# Patient Record
Sex: Female | Born: 1960
Health system: Southern US, Community
[De-identification: ages and names within clinical notes are randomized; demographics above are authoritative.]

## PROBLEM LIST (undated history)

## (undated) DIAGNOSIS — D649 Anemia, unspecified: Secondary | ICD-10-CM

## (undated) DIAGNOSIS — G8929 Other chronic pain: Secondary | ICD-10-CM

## (undated) DIAGNOSIS — H9190 Unspecified hearing loss, unspecified ear: Secondary | ICD-10-CM

## (undated) DIAGNOSIS — M797 Fibromyalgia: Secondary | ICD-10-CM

## (undated) DIAGNOSIS — I10 Essential (primary) hypertension: Secondary | ICD-10-CM

## (undated) DIAGNOSIS — I639 Cerebral infarction, unspecified: Secondary | ICD-10-CM

## (undated) DIAGNOSIS — R531 Weakness: Secondary | ICD-10-CM

## (undated) DIAGNOSIS — M549 Dorsalgia, unspecified: Secondary | ICD-10-CM

## (undated) DIAGNOSIS — F419 Anxiety disorder, unspecified: Secondary | ICD-10-CM

## (undated) DIAGNOSIS — R51 Headache: Secondary | ICD-10-CM

## (undated) DIAGNOSIS — R519 Headache, unspecified: Secondary | ICD-10-CM

## (undated) HISTORY — DX: Anemia, unspecified: D64.9

## (undated) HISTORY — DX: Headache: R51

## (undated) HISTORY — DX: Cerebral infarction, unspecified: I63.9

## (undated) HISTORY — DX: Unspecified hearing loss, unspecified ear: H91.90

## (undated) HISTORY — PX: SMALL INTESTINE SURGERY: SHX150

## (undated) HISTORY — DX: Weakness: R53.1

## (undated) HISTORY — DX: Headache, unspecified: R51.9

---

## 1998-12-15 HISTORY — PX: HERNIA REPAIR: SHX51

## 2008-12-15 HISTORY — PX: OTHER SURGICAL HISTORY: SHX169

## 2012-06-16 ENCOUNTER — Encounter (HOSPITAL_COMMUNITY): Payer: Self-pay | Admitting: Radiology

## 2012-06-16 ENCOUNTER — Inpatient Hospital Stay (HOSPITAL_COMMUNITY)
Admission: EM | Admit: 2012-06-16 | Discharge: 2012-06-21 | DRG: 988 | Disposition: A | Discharge status: Home / Self Care | Payer: Medicare Other | Attending: General Surgery | Admitting: General Surgery

## 2012-06-16 ENCOUNTER — Encounter (INDEPENDENT_AMBULATORY_CARE_PROVIDER_SITE_OTHER): Payer: Self-pay | Admitting: Surgery

## 2012-06-16 ENCOUNTER — Emergency Department (HOSPITAL_COMMUNITY): Payer: Medicare Other

## 2012-06-16 DIAGNOSIS — S51819A Laceration without foreign body of unspecified forearm, initial encounter: Secondary | ICD-10-CM

## 2012-06-16 DIAGNOSIS — IMO0001 Reserved for inherently not codable concepts without codable children: Secondary | ICD-10-CM | POA: Diagnosis present

## 2012-06-16 DIAGNOSIS — S2241XA Multiple fractures of ribs, right side, initial encounter for closed fracture: Secondary | ICD-10-CM

## 2012-06-16 DIAGNOSIS — Y92410 Unspecified street and highway as the place of occurrence of the external cause: Secondary | ICD-10-CM

## 2012-06-16 DIAGNOSIS — Y998 Other external cause status: Secondary | ICD-10-CM

## 2012-06-16 DIAGNOSIS — E876 Hypokalemia: Secondary | ICD-10-CM

## 2012-06-16 DIAGNOSIS — S060X9A Concussion with loss of consciousness of unspecified duration, initial encounter: Secondary | ICD-10-CM | POA: Diagnosis present

## 2012-06-16 DIAGNOSIS — S060X0A Concussion without loss of consciousness, initial encounter: Principal | ICD-10-CM | POA: Diagnosis present

## 2012-06-16 DIAGNOSIS — E278 Other specified disorders of adrenal gland: Secondary | ICD-10-CM | POA: Diagnosis present

## 2012-06-16 DIAGNOSIS — S82832A Other fracture of upper and lower end of left fibula, initial encounter for closed fracture: Secondary | ICD-10-CM | POA: Diagnosis present

## 2012-06-16 DIAGNOSIS — F419 Anxiety disorder, unspecified: Secondary | ICD-10-CM | POA: Diagnosis present

## 2012-06-16 DIAGNOSIS — S0990XA Unspecified injury of head, initial encounter: Secondary | ICD-10-CM

## 2012-06-16 DIAGNOSIS — I10 Essential (primary) hypertension: Secondary | ICD-10-CM

## 2012-06-16 DIAGNOSIS — S82899A Other fracture of unspecified lower leg, initial encounter for closed fracture: Secondary | ICD-10-CM | POA: Diagnosis present

## 2012-06-16 DIAGNOSIS — D3500 Benign neoplasm of unspecified adrenal gland: Secondary | ICD-10-CM | POA: Diagnosis present

## 2012-06-16 DIAGNOSIS — G8929 Other chronic pain: Secondary | ICD-10-CM

## 2012-06-16 DIAGNOSIS — IMO0002 Reserved for concepts with insufficient information to code with codable children: Secondary | ICD-10-CM

## 2012-06-16 DIAGNOSIS — S66909A Unspecified injury of unspecified muscle, fascia and tendon at wrist and hand level, unspecified hand, initial encounter: Secondary | ICD-10-CM

## 2012-06-16 DIAGNOSIS — S56921A Laceration of unspecified muscles, fascia and tendons at forearm level, right arm, initial encounter: Secondary | ICD-10-CM | POA: Diagnosis present

## 2012-06-16 DIAGNOSIS — S060XAA Concussion with loss of consciousness status unknown, initial encounter: Secondary | ICD-10-CM

## 2012-06-16 DIAGNOSIS — F411 Generalized anxiety disorder: Secondary | ICD-10-CM | POA: Diagnosis present

## 2012-06-16 DIAGNOSIS — T07XXXA Unspecified multiple injuries, initial encounter: Secondary | ICD-10-CM

## 2012-06-16 DIAGNOSIS — D62 Acute posthemorrhagic anemia: Secondary | ICD-10-CM | POA: Diagnosis not present

## 2012-06-16 DIAGNOSIS — S51012A Laceration without foreign body of left elbow, initial encounter: Secondary | ICD-10-CM | POA: Diagnosis present

## 2012-06-16 DIAGNOSIS — R404 Transient alteration of awareness: Secondary | ICD-10-CM

## 2012-06-16 DIAGNOSIS — S61409A Unspecified open wound of unspecified hand, initial encounter: Secondary | ICD-10-CM | POA: Diagnosis present

## 2012-06-16 DIAGNOSIS — S2249XA Multiple fractures of ribs, unspecified side, initial encounter for closed fracture: Secondary | ICD-10-CM

## 2012-06-16 DIAGNOSIS — R413 Other amnesia: Secondary | ICD-10-CM

## 2012-06-16 DIAGNOSIS — M797 Fibromyalgia: Secondary | ICD-10-CM

## 2012-06-16 DIAGNOSIS — S82409A Unspecified fracture of shaft of unspecified fibula, initial encounter for closed fracture: Secondary | ICD-10-CM

## 2012-06-16 DIAGNOSIS — M549 Dorsalgia, unspecified: Secondary | ICD-10-CM | POA: Diagnosis present

## 2012-06-16 DIAGNOSIS — S51811A Laceration without foreign body of right forearm, initial encounter: Secondary | ICD-10-CM | POA: Diagnosis present

## 2012-06-16 DIAGNOSIS — S51009A Unspecified open wound of unspecified elbow, initial encounter: Secondary | ICD-10-CM

## 2012-06-16 HISTORY — DX: Dorsalgia, unspecified: M54.9

## 2012-06-16 HISTORY — DX: Anxiety disorder, unspecified: F41.9

## 2012-06-16 HISTORY — DX: Concussion with loss of consciousness status unknown, initial encounter: S06.0XAA

## 2012-06-16 HISTORY — DX: Fibromyalgia: M79.7

## 2012-06-16 HISTORY — DX: Essential (primary) hypertension: I10

## 2012-06-16 HISTORY — DX: Other fracture of upper and lower end of left fibula, initial encounter for closed fracture: S82.832A

## 2012-06-16 HISTORY — DX: Other chronic pain: G89.29

## 2012-06-16 HISTORY — DX: Multiple fractures of ribs, right side, initial encounter for closed fracture: S22.41XA

## 2012-06-16 LAB — POCT I-STAT, CHEM 8
BUN: 13 mg/dL (ref 6–23)
Calcium, Ion: 1.26 mmol/L (ref 1.12–1.32)
Chloride: 107 mEq/L (ref 96–112)
Creatinine, Ser: 0.8 mg/dL (ref 0.50–1.10)
Glucose, Bld: 115 mg/dL — ABNORMAL HIGH (ref 70–99)
HCT: 41 % (ref 36.0–46.0)
Hemoglobin: 13.9 g/dL (ref 12.0–15.0)
Potassium: 3.3 mEq/L — ABNORMAL LOW (ref 3.5–5.1)
Sodium: 143 mEq/L (ref 135–145)
TCO2: 25 mmol/L (ref 0–100)

## 2012-06-16 LAB — PROTIME-INR
INR: 0.93 (ref 0.00–1.49)
Prothrombin Time: 12.7 seconds (ref 11.6–15.2)

## 2012-06-16 LAB — ETHANOL: Alcohol, Ethyl (B): <11 mg/dL (ref 0–11)

## 2012-06-16 LAB — TYPE AND SCREEN
ABO/RH(D): O POS
Antibody Screen: NEGATIVE

## 2012-06-16 LAB — COMPREHENSIVE METABOLIC PANEL
ALT: 40 U/L — ABNORMAL HIGH (ref 0–35)
AST: 59 U/L — ABNORMAL HIGH (ref 0–37)
Albumin: 3.8 g/dL (ref 3.5–5.2)
Alkaline Phosphatase: 82 U/L (ref 39–117)
BUN: 13 mg/dL (ref 6–23)
CO2: 22 mEq/L (ref 19–32)
Calcium: 9.8 mg/dL (ref 8.4–10.5)
Chloride: 106 mEq/L (ref 96–112)
Creatinine, Ser: 0.71 mg/dL (ref 0.50–1.10)
GFR calc Af Amer: >90 mL/min (ref >90)
GFR calc non Af Amer: >90 mL/min (ref >90)
Glucose, Bld: 123 mg/dL — ABNORMAL HIGH (ref 70–99)
Potassium: 3.4 mEq/L — ABNORMAL LOW (ref 3.5–5.1)
Sodium: 144 mEq/L (ref 135–145)
Total Bilirubin: 0.2 mg/dL — ABNORMAL LOW (ref 0.3–1.2)
Total Protein: 7 g/dL (ref 6.0–8.3)

## 2012-06-16 LAB — CBC
HCT: 40.8 % (ref 36.0–46.0)
Hemoglobin: 13.6 g/dL (ref 12.0–15.0)
MCH: 28.6 pg (ref 26.0–34.0)
MCHC: 33.3 g/dL (ref 30.0–36.0)
MCV: 85.9 fL (ref 78.0–100.0)
Platelets: 216 10*3/uL (ref 150–400)
RBC: 4.75 MIL/uL (ref 3.87–5.11)
RDW: 13.6 % (ref 11.5–15.5)
WBC: 17.5 10*3/uL — ABNORMAL HIGH (ref 4.0–10.5)

## 2012-06-16 LAB — URINALYSIS, ROUTINE W REFLEX MICROSCOPIC
Bilirubin Urine: NEGATIVE
Glucose, UA: NEGATIVE mg/dL
Hgb urine dipstick: NEGATIVE
Ketones, ur: NEGATIVE mg/dL
Leukocytes, UA: NEGATIVE
Nitrite: NEGATIVE
Protein, ur: NEGATIVE mg/dL
Specific Gravity, Urine: 1.018 (ref 1.005–1.030)
Urobilinogen, UA: 1 mg/dL (ref 0.0–1.0)
pH: 7 (ref 5.0–8.0)

## 2012-06-16 LAB — ABO/RH: ABO/RH(D): O POS

## 2012-06-16 LAB — LACTIC ACID, PLASMA: Lactic Acid, Venous: 1.8 mmol/L (ref 0.5–2.2)

## 2012-06-16 MED ORDER — ONDANSETRON HCL 4 MG/2ML IJ SOLN
4.0000 mg | Freq: Once | INTRAMUSCULAR | Status: AC
  Administered 2012-06-16: 4 mg via INTRAVENOUS
  Filled 2012-06-16: qty 2

## 2012-06-16 MED ORDER — FENTANYL CITRATE 0.05 MG/ML IJ SOLN
INTRAMUSCULAR | Status: AC
  Administered 2012-06-16: 100 ug via INTRAVENOUS
  Filled 2012-06-16: qty 2

## 2012-06-16 MED ORDER — ACETAMINOPHEN 650 MG RE SUPP: 650.0000 mg | Freq: Four times a day (QID) | RECTAL | Status: DC | PRN

## 2012-06-16 MED ORDER — PROMETHAZINE HCL 25 MG/ML IJ SOLN: 12.5000 mg | Freq: Four times a day (QID) | INTRAMUSCULAR | Status: DC | PRN

## 2012-06-16 MED ORDER — ACETAMINOPHEN 325 MG PO TABS
325.0000 mg | ORAL_TABLET | Freq: Four times a day (QID) | ORAL | Status: DC | PRN
  Administered 2012-06-17: 650 mg via ORAL
  Filled 2012-06-16: qty 2

## 2012-06-16 MED ORDER — KETOROLAC TROMETHAMINE 15 MG/ML IJ SOLN
15.0000 mg | INTRAMUSCULAR | Status: AC
  Administered 2012-06-16: 15 mg via INTRAVENOUS
  Filled 2012-06-16: qty 1

## 2012-06-16 MED ORDER — CLINDAMYCIN PHOSPHATE 900 MG/50ML IV SOLN
900.0000 mg | INTRAVENOUS | Status: AC
  Administered 2012-06-16: 900 mg via INTRAVENOUS
  Filled 2012-06-16: qty 50

## 2012-06-16 MED ORDER — ONDANSETRON HCL 4 MG/2ML IJ SOLN: 4.0000 mg | Freq: Four times a day (QID) | INTRAMUSCULAR | Status: DC | PRN

## 2012-06-16 MED ORDER — HYDROMORPHONE HCL PF 1 MG/ML IJ SOLN
INTRAMUSCULAR | Status: AC
  Administered 2012-06-16: 1 mg via INTRAVENOUS
  Filled 2012-06-16: qty 1

## 2012-06-16 MED ORDER — ALUM & MAG HYDROXIDE-SIMETH 200-200-20 MG/5ML PO SUSP: 30.0000 mL | Freq: Four times a day (QID) | ORAL | Status: DC | PRN

## 2012-06-16 MED ORDER — HYDROMORPHONE HCL PF 1 MG/ML IJ SOLN
1.0000 mg | Freq: Once | INTRAMUSCULAR | Status: AC
  Administered 2012-06-16: 1 mg via INTRAVENOUS

## 2012-06-16 MED ORDER — ENOXAPARIN SODIUM 40 MG/0.4ML ~~LOC~~ SOLN
40.0000 mg | SUBCUTANEOUS | Status: DC
  Administered 2012-06-17 – 2012-06-21 (×4): 40 mg via SUBCUTANEOUS
  Filled 2012-06-16 (×5): qty 0.4

## 2012-06-16 MED ORDER — NAPROXEN SODIUM 550 MG PO TABS: 550.0000 mg | ORAL_TABLET | Freq: Two times a day (BID) | ORAL | Status: DC

## 2012-06-16 MED ORDER — KETOROLAC TROMETHAMINE 60 MG/2ML IM SOLN
INTRAMUSCULAR | Status: AC
  Filled 2012-06-16: qty 2

## 2012-06-16 MED ORDER — LOSARTAN POTASSIUM-HCTZ 100-12.5 MG PO TABS: 1.0000 | ORAL_TABLET | Freq: Every day | ORAL | Status: DC

## 2012-06-16 MED ORDER — ALPRAZOLAM 0.5 MG PO TABS
1.0000 mg | ORAL_TABLET | Freq: Every day | ORAL | Status: DC
  Administered 2012-06-17 – 2012-06-21 (×4): 1 mg via ORAL
  Filled 2012-06-16 (×2): qty 2
  Filled 2012-06-16: qty 4
  Filled 2012-06-16: qty 1

## 2012-06-16 MED ORDER — LIP MEDEX EX OINT
1.0000 "application " | TOPICAL_OINTMENT | Freq: Two times a day (BID) | CUTANEOUS | Status: DC
  Filled 2012-06-16: qty 7

## 2012-06-16 MED ORDER — ONDANSETRON HCL 4 MG/2ML IJ SOLN
4.0000 mg | Freq: Once | INTRAMUSCULAR | Status: AC
  Administered 2012-06-16: 4 mg via INTRAVENOUS

## 2012-06-16 MED ORDER — LORAZEPAM BOLUS VIA INFUSION
0.5000 mg | Freq: Three times a day (TID) | INTRAVENOUS | Status: DC | PRN
  Filled 2012-06-16: qty 1

## 2012-06-16 MED ORDER — MAGNESIUM HYDROXIDE 400 MG/5ML PO SUSP
30.0000 mL | Freq: Two times a day (BID) | ORAL | Status: DC | PRN
  Administered 2012-06-17: 30 mL via ORAL
  Filled 2012-06-16: qty 30

## 2012-06-16 MED ORDER — HYDROMORPHONE HCL PF 1 MG/ML IJ SOLN
0.5000 mg | INTRAMUSCULAR | Status: DC | PRN
Start: 2012-06-16 — End: 2012-06-21
  Administered 2012-06-17: 1 mg via INTRAVENOUS
  Administered 2012-06-17: 2 mg via INTRAVENOUS
  Administered 2012-06-17 (×3): 1 mg via INTRAVENOUS
  Administered 2012-06-18: 0.5 mg via INTRAVENOUS
  Administered 2012-06-18: 2 mg via INTRAVENOUS
  Administered 2012-06-18 (×2): 0.5 mg via INTRAVENOUS
  Administered 2012-06-20: 1 mg via INTRAVENOUS
  Filled 2012-06-16: qty 1
  Filled 2012-06-16: qty 2
  Filled 2012-06-16 (×3): qty 1
  Filled 2012-06-16: qty 2
  Filled 2012-06-16 (×4): qty 1

## 2012-06-16 MED ORDER — SODIUM CHLORIDE 0.9 % IV SOLN: 250.0000 mL | INTRAVENOUS | Status: DC | PRN

## 2012-06-16 MED ORDER — METOPROLOL TARTRATE 12.5 MG HALF TABLET
12.5000 mg | ORAL_TABLET | Freq: Two times a day (BID) | ORAL | Status: DC | PRN
  Filled 2012-06-16: qty 0.5

## 2012-06-16 MED ORDER — TETANUS-DIPHTH-ACELL PERTUSSIS 5-2.5-18.5 LF-MCG/0.5 IM SUSP
0.5000 mL | Freq: Once | INTRAMUSCULAR | Status: AC
  Administered 2012-06-16: 0.5 mL via INTRAMUSCULAR

## 2012-06-16 MED ORDER — FENTANYL CITRATE 0.05 MG/ML IJ SOLN
INTRAMUSCULAR | Status: AC
  Filled 2012-06-16: qty 2

## 2012-06-16 MED ORDER — SODIUM CHLORIDE 0.9 % IJ SOLN
3.0000 mL | Freq: Two times a day (BID) | INTRAMUSCULAR | Status: DC
  Administered 2012-06-17 – 2012-06-21 (×3): 3 mL via INTRAVENOUS

## 2012-06-16 MED ORDER — PSYLLIUM 95 % PO PACK
1.0000 | PACK | Freq: Two times a day (BID) | ORAL | Status: DC
  Administered 2012-06-17 – 2012-06-21 (×8): 1 via ORAL
  Filled 2012-06-16 (×11): qty 1

## 2012-06-16 MED ORDER — FENTANYL CITRATE 0.05 MG/ML IJ SOLN
INTRAMUSCULAR | Status: AC | PRN
  Administered 2012-06-16: 50 ug via INTRAVENOUS

## 2012-06-16 MED ORDER — FENTANYL CITRATE 0.05 MG/ML IJ SOLN
100.0000 ug | Freq: Once | INTRAMUSCULAR | Status: AC
  Administered 2012-06-16: 100 ug via INTRAVENOUS

## 2012-06-16 MED ORDER — TETANUS-DIPHTH-ACELL PERTUSSIS 5-2.5-18.5 LF-MCG/0.5 IM SUSP
INTRAMUSCULAR | Status: AC
  Administered 2012-06-16: 0.5 mL via INTRAMUSCULAR
  Filled 2012-06-16: qty 0.5

## 2012-06-16 MED ORDER — PANTOPRAZOLE SODIUM 40 MG PO TBEC
40.0000 mg | DELAYED_RELEASE_TABLET | Freq: Every day | ORAL | Status: DC
  Administered 2012-06-17 – 2012-06-21 (×4): 40 mg via ORAL
  Filled 2012-06-16 (×4): qty 1

## 2012-06-16 MED ORDER — SODIUM CHLORIDE 0.9 % IJ SOLN: 3.0000 mL | INTRAMUSCULAR | Status: DC | PRN

## 2012-06-16 MED ORDER — LACTATED RINGERS IV SOLN
INTRAVENOUS | Status: DC
  Administered 2012-06-17 – 2012-06-21 (×7): via INTRAVENOUS

## 2012-06-16 MED ORDER — MAGIC MOUTHWASH
15.0000 mL | Freq: Four times a day (QID) | ORAL | Status: DC | PRN
  Filled 2012-06-16: qty 15

## 2012-06-16 MED ORDER — LACTATED RINGERS IV BOLUS (SEPSIS): 1000.0000 mL | Freq: Three times a day (TID) | INTRAVENOUS | Status: AC | PRN

## 2012-06-16 MED ORDER — HYDROCODONE-ACETAMINOPHEN 10-325 MG PO TABS
1.0000 | ORAL_TABLET | Freq: Four times a day (QID) | ORAL | Status: DC | PRN
  Administered 2012-06-17 – 2012-06-20 (×11): 1 via ORAL
  Filled 2012-06-16 (×11): qty 1

## 2012-06-16 MED ORDER — CARISOPRODOL 350 MG PO TABS
350.0000 mg | ORAL_TABLET | Freq: Four times a day (QID) | ORAL | Status: DC | PRN
  Filled 2012-06-16 (×2): qty 1

## 2012-06-16 MED ORDER — DIPHENHYDRAMINE HCL 50 MG/ML IJ SOLN
12.5000 mg | Freq: Four times a day (QID) | INTRAMUSCULAR | Status: DC | PRN
  Administered 2012-06-20 – 2012-06-21 (×2): 25 mg via INTRAVENOUS
  Filled 2012-06-16 (×2): qty 1

## 2012-06-16 MED ORDER — SODIUM CHLORIDE 0.9 % IV BOLUS (SEPSIS)
1000.0000 mL | Freq: Once | INTRAVENOUS | Status: AC
  Administered 2012-06-16: 1000 mL via INTRAVENOUS

## 2012-06-16 MED ORDER — ONDANSETRON HCL 4 MG/2ML IJ SOLN
INTRAMUSCULAR | Status: AC
  Filled 2012-06-16: qty 2

## 2012-06-16 MED ORDER — AMLODIPINE BESYLATE 5 MG PO TABS
5.0000 mg | ORAL_TABLET | Freq: Every day | ORAL | Status: DC
  Administered 2012-06-17 – 2012-06-21 (×4): 5 mg via ORAL
  Filled 2012-06-16 (×5): qty 1

## 2012-06-16 MED ORDER — LEVALBUTEROL HCL 1.25 MG/0.5ML IN NEBU
1.2500 mg | INHALATION_SOLUTION | Freq: Four times a day (QID) | RESPIRATORY_TRACT | Status: DC | PRN
  Filled 2012-06-16: qty 0.5

## 2012-06-16 MED ORDER — CEFAZOLIN SODIUM 1-5 GM-% IV SOLN
1.0000 g | Freq: Once | INTRAVENOUS | Status: DC
  Filled 2012-06-16: qty 50

## 2012-06-16 MED ORDER — IOHEXOL 300 MG/ML  SOLN
100.0000 mL | Freq: Once | INTRAMUSCULAR | Status: AC | PRN
  Administered 2012-06-16: 80 mL via INTRAVENOUS

## 2012-06-16 MED ORDER — SODIUM CHLORIDE 0.9 % IV SOLN
Freq: Once | INTRAVENOUS | Status: AC
  Administered 2012-06-16: 21:00:00 via INTRAVENOUS

## 2012-06-16 MED ORDER — HYDROMORPHONE HCL PF 1 MG/ML IJ SOLN
1.0000 mg | Freq: Once | INTRAMUSCULAR | Status: AC
  Administered 2012-06-16: 1 mg via INTRAVENOUS
  Filled 2012-06-16: qty 1
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
## 2012-06-16 NOTE — ED Notes (Signed)
From xray dept via stretcher - remains awake, alert, oriented x4

## 2012-06-16 NOTE — ED Notes (Signed)
MD in to reassess; [facility] elevated per MD - C collar remains intact
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
## 2012-06-16 NOTE — ED Notes (Signed)
Inserted the foley cath in patient cloudy urine in return
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
## 2012-06-16 NOTE — ED Notes (Addendum)
Personal belongings:  Placed in CDU locker #1  2 [employer] Master Cards 1 [employer] credit card 1 [employer] credit card 1 [employer] Debit card 1 [employer] Master Card Cash: 5 $20 bills, 1 $5 bill TOTAL: $105 dollars   Upper and Lower dentures (in plastic bag, inside pt's purse with medications).  1 pair of shorts, 1 t-shirt, 1 pair of white tennis shoes
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
## 2012-06-16 NOTE — Progress Notes (Signed)
Orthopedic Tech Progress Note Patient Details:  Gabriella Richards 1961-01-29 [id number]  Ortho Devices Type of Ortho Device: Finger splint Ortho Device/Splint Location: (R) UE index finger Ortho Device/Splint Interventions: Application   Jennye Moccasin 06/16/2012, 10:33 PM

## 2012-06-16 NOTE — ED Provider Notes (Signed)
I performed the wound repair only for this patient. Please see Dr. Marylen Ponto note for complete documentation. Pt's wounds were grossly contaminated; they were irrigated copiously with syringe irrigation and debrided of all visible foreign material (one small piece of glass in elbow wound). Wounds on hand closed loosely per hand recommendations. While at bedside, pt repeatedly asks the same questions: "Is it Thursday? [it is Wednesday] How did I get here? What hospital am I in?" Also very emotional, crying about her family and a son who passed away. Dr. Juleen China aware.  LACERATION REPAIR Performed by: Grant Fontana Authorized by: Grant Fontana Consent: Verbal consent obtained. Risks and benefits: risks, benefits and alternatives were discussed Consent given by: patient Patient identity confirmed: provided demographic data Prepped and Draped in normal sterile fashion Wound explored  Laceration Location: R index and middle finger MCP jts, dorsal side  Laceration Length: 1 and 1cm  No Foreign Bodies seen or palpated  Anesthesia: local infiltration  Local anesthetic: lidocaine 2% without epinephrine  Anesthetic total: 4 ml  Irrigation method: syringe Amount of cleaning: standard  Skin closure: 5-0 Prolene  Number of sutures: middle mcp 5; index mcp 7  Technique: simple interrupted, loosely placed  Patient tolerance: Patient tolerated the procedure well with no immediate complications.    LACERATION REPAIR Performed by: Grant Fontana Authorized by: Grant Fontana Consent: Verbal consent obtained. Risks and benefits: risks, benefits and alternatives were discussed Consent given by: patient Patient identity confirmed: provided demographic data Prepped and Draped in normal sterile fashion Wound explored  Laceration Location: L elbow, "V" shaped flap  Laceration Length: 4x3cm  No Foreign Bodies seen or palpated - 1 piece of glass and other dirt/foreign  material debrided  Anesthesia: local infiltration  Local anesthetic: lidocaine 2% with epinephrine  Anesthetic total: 7 ml  Irrigation method: syringe Amount of cleaning: copious  Skin closure: 4-0 Prolene  Number of sutures: 9  Technique: horizontal mattress with corner stitch  Patient tolerance: Patient tolerated the procedure well with no immediate complications.      Grant Fontana, PA-C 06/16/12 2234
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
## 2012-06-16 NOTE — ED Notes (Signed)
Pt. Transferred to radiology. Primary and Secondary RN accompanied Pt.

## 2012-06-16 NOTE — ED Notes (Signed)
PA at bedside suturing - pt tolerating well

## 2012-06-17 ENCOUNTER — Encounter (HOSPITAL_COMMUNITY): Payer: Self-pay | Admitting: *Deleted

## 2012-06-17 ENCOUNTER — Observation Stay (HOSPITAL_COMMUNITY): Payer: Medicare Other

## 2012-06-17 LAB — BASIC METABOLIC PANEL WITH GFR
BUN: 9 mg/dL (ref 6–23)
CO2: 25 meq/L (ref 19–32)
Calcium: 8.8 mg/dL (ref 8.4–10.5)
Chloride: 107 meq/L (ref 96–112)
Creatinine, Ser: 0.57 mg/dL (ref 0.50–1.10)
GFR calc Af Amer: >90 mL/min
GFR calc non Af Amer: >90 mL/min
Glucose, Bld: 110 mg/dL — ABNORMAL HIGH (ref 70–99)
Potassium: 3.3 meq/L — ABNORMAL LOW (ref 3.5–5.1)
Sodium: 143 meq/L (ref 135–145)

## 2012-06-17 LAB — CBC
HCT: 34.7 % — ABNORMAL LOW (ref 36.0–46.0)
Hemoglobin: 11.6 g/dL — ABNORMAL LOW (ref 12.0–15.0)
MCH: 28.9 pg (ref 26.0–34.0)
MCHC: 33.4 g/dL (ref 30.0–36.0)
MCV: 86.5 fL (ref 78.0–100.0)
Platelets: 197 10*3/uL (ref 150–400)
RBC: 4.01 MIL/uL (ref 3.87–5.11)
RDW: 13.7 % (ref 11.5–15.5)
WBC: 10.2 10*3/uL (ref 4.0–10.5)

## 2012-06-17 MED ORDER — CLINDAMYCIN PHOSPHATE 900 MG/50ML IV SOLN
900.0000 mg | INTRAVENOUS | Status: DC
  Filled 2012-06-17: qty 50

## 2012-06-17 MED ORDER — BACITRACIN-NEOMYCIN-POLYMYXIN 400-5-5000 EX OINT
TOPICAL_OINTMENT | CUTANEOUS | Status: AC
  Administered 2012-06-17: 18:00:00
  Filled 2012-06-17: qty 2

## 2012-06-17 MED ORDER — PNEUMOCOCCAL VAC POLYVALENT 25 MCG/0.5ML IJ INJ
0.5000 mL | INJECTION | INTRAMUSCULAR | Status: AC
  Administered 2012-06-18: 0.5 mL via INTRAMUSCULAR
  Filled 2012-06-17: qty 0.5

## 2012-06-17 MED ORDER — ONDANSETRON 8 MG/NS 50 ML IVPB
8.0000 mg | Freq: Four times a day (QID) | INTRAVENOUS | Status: DC | PRN
  Filled 2012-06-17: qty 8

## 2012-06-17 MED ORDER — HYDROCHLOROTHIAZIDE 12.5 MG PO CAPS
12.5000 mg | ORAL_CAPSULE | Freq: Every day | ORAL | Status: DC
  Administered 2012-06-17 – 2012-06-21 (×4): 12.5 mg via ORAL
  Filled 2012-06-17 (×5): qty 1

## 2012-06-17 MED ORDER — CHLORHEXIDINE GLUCONATE 4 % EX LIQD
60.0000 mL | Freq: Once | CUTANEOUS | Status: DC
  Filled 2012-06-17: qty 60

## 2012-06-17 MED ORDER — LORAZEPAM 2 MG/ML IJ SOLN
0.5000 mg | Freq: Three times a day (TID) | INTRAMUSCULAR | Status: DC | PRN
  Administered 2012-06-17 – 2012-06-20 (×3): 1 mg via INTRAVENOUS
  Filled 2012-06-17 (×3): qty 1

## 2012-06-17 MED ORDER — POTASSIUM CHLORIDE CRYS ER 20 MEQ PO TBCR
40.0000 meq | EXTENDED_RELEASE_TABLET | Freq: Once | ORAL | Status: AC
  Administered 2012-06-17: 40 meq via ORAL
  Filled 2012-06-17: qty 2

## 2012-06-17 MED ORDER — NAPROXEN 500 MG PO TABS
500.0000 mg | ORAL_TABLET | Freq: Two times a day (BID) | ORAL | Status: DC
  Administered 2012-06-17 – 2012-06-21 (×9): 500 mg via ORAL
  Filled 2012-06-17 (×12): qty 1

## 2012-06-17 MED ORDER — BLISTEX EX OINT
TOPICAL_OINTMENT | Freq: Two times a day (BID) | CUTANEOUS | Status: DC
  Administered 2012-06-17 – 2012-06-21 (×4): via TOPICAL
  Filled 2012-06-17 (×2): qty 10

## 2012-06-17 MED ORDER — LOSARTAN POTASSIUM 50 MG PO TABS
100.0000 mg | ORAL_TABLET | Freq: Every day | ORAL | Status: DC
  Administered 2012-06-17 – 2012-06-21 (×4): 100 mg via ORAL
  Filled 2012-06-17 (×5): qty 2
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
## 2012-06-17 NOTE — Consult Note (Signed)
FULL NOTE DICTATED CHART REVIEWED PT WITH LACERATION TO DORSUM OF LONG AND INDEX FINGER PT WITH POOR EXTENSION TO INDEX FINGER SUSPECT TENDON LACERATION WOULD RECOMMEND WOUND EXPLORATION AND TENDON REPAIR AS INDICATED PLAN FOR SURGERY ON 06/19/2012 WILL CONTINUE TO FOLLOW [id number] JOB ID
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
## 2012-06-17 NOTE — Progress Notes (Signed)
Orthopedic Tech Progress Note Patient Details:  Gabriella Richards 05-12-1961 [id number]  Ortho Devices Type of Ortho Device: Short leg splint Ortho Device/Splint Location: (R) UE index finger Ortho Device/Splint Interventions: Application   Shawnie Pons 06/17/2012, 5:47 PM

## 2012-06-17 NOTE — Progress Notes (Signed)
Orthopedic Tech Progress Note Patient Details:  Gabriella Richards June 29, 1961 [id number]  Ortho Devices Type of Ortho Device: CAM walker Ortho Device/Splint Location: (L) LE Ortho Device/Splint Interventions: Application   Jennye Moccasin 06/17/2012, 9:20 PM

## 2012-06-18 LAB — BASIC METABOLIC PANEL
CO2: 26 mEq/L (ref 19–32)
Calcium: 8.9 mg/dL (ref 8.4–10.5)
Creatinine, Ser: 0.63 mg/dL (ref 0.50–1.10)
Glucose, Bld: 108 mg/dL — ABNORMAL HIGH (ref 70–99)

## 2012-06-18 MED ORDER — CEFAZOLIN SODIUM-DEXTROSE 2-3 GM-% IV SOLR
2.0000 g | INTRAVENOUS | Status: AC
  Administered 2012-06-19: 2 g via INTRAVENOUS
  Filled 2012-06-18: qty 50

## 2012-06-18 MED ORDER — CHLORHEXIDINE GLUCONATE 4 % EX LIQD
60.0000 mL | Freq: Once | CUTANEOUS | Status: AC
  Administered 2012-06-19: 4 via TOPICAL
  Filled 2012-06-18: qty 60
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
## 2012-06-18 NOTE — Progress Notes (Signed)
PLAN FOR OR TOMORROW FOR RIGHT INDEX AND LONG FINGER WOUND EXPLORATIONS AND REPAIR AS INDICATED HAVE DISCUSSED WITH PATIENT REASON FOR SURGERY  WILL SEE PATIENT PRIOR TO SURGERY TO FINALIZE CONSENT AND MARK SITE

## 2012-06-18 NOTE — Progress Notes (Signed)
UR complete 

## 2012-06-19 ENCOUNTER — Encounter (HOSPITAL_COMMUNITY): Admission: EM | Disposition: A | Discharge status: Home / Self Care | Payer: Self-pay | Source: Home / Self Care

## 2012-06-19 ENCOUNTER — Encounter (HOSPITAL_COMMUNITY): Payer: Self-pay | Admitting: Anesthesiology

## 2012-06-19 ENCOUNTER — Inpatient Hospital Stay (HOSPITAL_COMMUNITY): Payer: Medicare Other | Admitting: Anesthesiology

## 2012-06-19 HISTORY — PX: TENDON REPAIR: SHX5111

## 2012-06-19 LAB — BASIC METABOLIC PANEL
Calcium: 9.1 mg/dL (ref 8.4–10.5)
GFR calc Af Amer: >90 mL/min (ref >90)
GFR calc non Af Amer: >90 mL/min (ref >90)
Potassium: 3.4 mEq/L — ABNORMAL LOW (ref 3.5–5.1)
Sodium: 143 mEq/L (ref 135–145)

## 2012-06-19 LAB — CBC
Hemoglobin: 9.9 g/dL — ABNORMAL LOW (ref 12.0–15.0)
MCH: 28.4 pg (ref 26.0–34.0)
Platelets: 163 10*3/uL (ref 150–400)
RBC: 3.49 MIL/uL — ABNORMAL LOW (ref 3.87–5.11)
WBC: 9.1 10*3/uL (ref 4.0–10.5)

## 2012-06-19 LAB — SURGICAL PCR SCREEN
MRSA, PCR: NEGATIVE
Staphylococcus aureus: NEGATIVE

## 2012-06-19 SURGERY — TENDON REPAIR
Anesthesia: General | Site: Hand | Laterality: Right | Wound class: Clean

## 2012-06-19 MED ORDER — BUPIVACAINE HCL (PF) 0.25 % IJ SOLN
INTRAMUSCULAR | Status: AC
  Filled 2012-06-19: qty 30

## 2012-06-19 MED ORDER — ACETAMINOPHEN 10 MG/ML IV SOLN: 1000.0000 mg | Freq: Once | INTRAVENOUS | Status: DC | PRN

## 2012-06-19 MED ORDER — PROPOFOL 10 MG/ML IV BOLUS
INTRAVENOUS | Status: DC | PRN
  Administered 2012-06-19: 160 mg via INTRAVENOUS

## 2012-06-19 MED ORDER — LACTATED RINGERS IV SOLN
INTRAVENOUS | Status: DC | PRN
  Administered 2012-06-19: 12:00:00 via INTRAVENOUS

## 2012-06-19 MED ORDER — 0.9 % SODIUM CHLORIDE (POUR BTL) OPTIME
TOPICAL | Status: DC | PRN
  Administered 2012-06-19: 1000 mL

## 2012-06-19 MED ORDER — POTASSIUM CHLORIDE CRYS ER 20 MEQ PO TBCR
20.0000 meq | EXTENDED_RELEASE_TABLET | Freq: Two times a day (BID) | ORAL | Status: DC
  Administered 2012-06-19 – 2012-06-21 (×5): 20 meq via ORAL
  Filled 2012-06-19 (×6): qty 1

## 2012-06-19 MED ORDER — FENTANYL CITRATE 0.05 MG/ML IJ SOLN
INTRAMUSCULAR | Status: DC | PRN
  Administered 2012-06-19: 100 ug via INTRAVENOUS
  Administered 2012-06-19: 25 ug via INTRAVENOUS

## 2012-06-19 MED ORDER — HYDROMORPHONE HCL PF 1 MG/ML IJ SOLN
0.2500 mg | INTRAMUSCULAR | Status: DC | PRN
  Administered 2012-06-19 (×4): 0.5 mg via INTRAVENOUS

## 2012-06-19 MED ORDER — BUPIVACAINE HCL (PF) 0.25 % IJ SOLN
INTRAMUSCULAR | Status: DC | PRN
  Administered 2012-06-19: 10 mL

## 2012-06-19 MED ORDER — LIDOCAINE HCL (CARDIAC) 20 MG/ML IV SOLN
INTRAVENOUS | Status: DC | PRN
  Administered 2012-06-19: 40 mg via INTRAVENOUS

## 2012-06-19 MED ORDER — PHENYLEPHRINE HCL 10 MG/ML IJ SOLN
INTRAMUSCULAR | Status: DC | PRN
  Administered 2012-06-19 (×2): 80 ug via INTRAVENOUS

## 2012-06-19 MED ORDER — MIDAZOLAM HCL 5 MG/5ML IJ SOLN
INTRAMUSCULAR | Status: DC | PRN
  Administered 2012-06-19: 2 mg via INTRAVENOUS

## 2012-06-19 MED ORDER — HYDROMORPHONE HCL PF 1 MG/ML IJ SOLN
INTRAMUSCULAR | Status: AC
  Administered 2012-06-19: 0.5 mg via INTRAVENOUS
  Filled 2012-06-19: qty 1

## 2012-06-19 MED ORDER — ONDANSETRON HCL 4 MG/2ML IJ SOLN
INTRAMUSCULAR | Status: DC | PRN
  Administered 2012-06-19: 4 mg via INTRAVENOUS

## 2012-06-19 MED ORDER — ONDANSETRON HCL 4 MG/2ML IJ SOLN: 4.0000 mg | Freq: Once | INTRAMUSCULAR | Status: DC | PRN

## 2012-06-19 MED ORDER — POLYETHYLENE GLYCOL 3350 17 G PO PACK
17.0000 g | PACK | Freq: Two times a day (BID) | ORAL | Status: DC
  Administered 2012-06-19 – 2012-06-21 (×5): 17 g via ORAL
  Filled 2012-06-19 (×7): qty 1

## 2012-06-19 MED ORDER — CLINDAMYCIN PHOSPHATE 300 MG/50ML IV SOLN
300.0000 mg | Freq: Three times a day (TID) | INTRAVENOUS | Status: AC
  Administered 2012-06-19 – 2012-06-20 (×3): 300 mg via INTRAVENOUS
  Filled 2012-06-19 (×3): qty 50

## 2012-06-19 SURGICAL SUPPLY — 48 items
BANDAGE ELASTIC 3 VELCRO ST LF (GAUZE/BANDAGES/DRESSINGS) ×4 IMPLANT
BANDAGE ELASTIC 4 VELCRO ST LF (GAUZE/BANDAGES/DRESSINGS) IMPLANT
BANDAGE GAUZE ELAST BULKY 4 IN (GAUZE/BANDAGES/DRESSINGS) ×2 IMPLANT
BNDG COHESIVE 1X5 TAN STRL LF (GAUZE/BANDAGES/DRESSINGS) IMPLANT
BNDG ESMARK 4X9 LF (GAUZE/BANDAGES/DRESSINGS) ×2 IMPLANT
CLOTH BEACON ORANGE TIMEOUT ST (SAFETY) ×2 IMPLANT
CORDS BIPOLAR (ELECTRODE) ×2 IMPLANT
COVER SURGICAL LIGHT HANDLE (MISCELLANEOUS) ×2 IMPLANT
CUFF TOURNIQUET SINGLE 18IN (TOURNIQUET CUFF) ×2 IMPLANT
CUFF TOURNIQUET SINGLE 24IN (TOURNIQUET CUFF) IMPLANT
DRAPE SURG 17X23 STRL (DRAPES) ×2 IMPLANT
DRSG ADAPTIC 3X8 NADH LF (GAUZE/BANDAGES/DRESSINGS) IMPLANT
DRSG EMULSION OIL 3X3 NADH (GAUZE/BANDAGES/DRESSINGS) ×2 IMPLANT
GAUZE SPONGE 2X2 8PLY STRL LF (GAUZE/BANDAGES/DRESSINGS) IMPLANT
GLOVE BIOGEL PI IND STRL 8.5 (GLOVE) ×1 IMPLANT
GLOVE BIOGEL PI INDICATOR 8.5 (GLOVE) ×1
GLOVE SURG ORTHO 8.0 STRL STRW (GLOVE) ×2 IMPLANT
GOWN PREVENTION PLUS XLARGE (GOWN DISPOSABLE) ×4 IMPLANT
GOWN STRL NON-REIN LRG LVL3 (GOWN DISPOSABLE) IMPLANT
KIT BASIN OR (CUSTOM PROCEDURE TRAY) ×2 IMPLANT
KIT ROOM TURNOVER OR (KITS) ×2 IMPLANT
MANIFOLD NEPTUNE II (INSTRUMENTS) IMPLANT
NEEDLE HYPO 25GX1X1/2 BEV (NEEDLE) IMPLANT
NS IRRIG 1000ML POUR BTL (IV SOLUTION) ×2 IMPLANT
PACK ORTHO EXTREMITY (CUSTOM PROCEDURE TRAY) ×2 IMPLANT
PAD ARMBOARD 7.5X6 YLW CONV (MISCELLANEOUS) ×4 IMPLANT
PAD CAST 3X4 CTTN HI CHSV (CAST SUPPLIES) ×2 IMPLANT
PAD CAST 4YDX4 CTTN HI CHSV (CAST SUPPLIES) IMPLANT
PADDING CAST COTTON 3X4 STRL (CAST SUPPLIES) ×2
PADDING CAST COTTON 4X4 STRL (CAST SUPPLIES)
SOAP 2 % CHG 4 OZ (WOUND CARE) ×2 IMPLANT
SPECIMEN JAR SMALL (MISCELLANEOUS) IMPLANT
SPLINT FIBERGLASS 3X35 (CAST SUPPLIES) ×2 IMPLANT
SPONGE GAUZE 2X2 STER 10/PKG (GAUZE/BANDAGES/DRESSINGS)
SPONGE GAUZE 4X4 12PLY (GAUZE/BANDAGES/DRESSINGS) ×2 IMPLANT
SUCTION FRAZIER TIP 10 FR DISP (SUCTIONS) ×2 IMPLANT
SUT ETHIBOND 4 0 TF (SUTURE) ×2 IMPLANT
SUT MERSILENE 4 0 P 3 (SUTURE) IMPLANT
SUT PROLENE 3 0 PS 2 (SUTURE) ×4 IMPLANT
SUT PROLENE 4 0 PS 2 18 (SUTURE) IMPLANT
SUT VIC AB 2-0 CT1 27 (SUTURE)
SUT VIC AB 2-0 CT1 TAPERPNT 27 (SUTURE) IMPLANT
SYR CONTROL 10ML LL (SYRINGE) IMPLANT
TOWEL OR 17X24 6PK STRL BLUE (TOWEL DISPOSABLE) ×2 IMPLANT
TOWEL OR 17X26 10 PK STRL BLUE (TOWEL DISPOSABLE) ×2 IMPLANT
TUBE CONNECTING 12X1/4 (SUCTIONS) ×2 IMPLANT
UNDERPAD 30X30 INCONTINENT (UNDERPADS AND DIAPERS) ×2 IMPLANT
WATER STERILE IRR 1000ML POUR (IV SOLUTION) IMPLANT

## 2012-06-19 NOTE — Anesthesia Postprocedure Evaluation (Signed)
  Anesthesia Post-op Note  Patient: Gabriella Richards  Procedure(s) Performed: Procedure(s) (LRB): TENDON REPAIR (Right)  Patient Location: PACU  Anesthesia Type: General  Level of Consciousness: awake, alert  and oriented  Airway and Oxygen Therapy: Patient Spontanous Breathing and Patient connected to nasal cannula oxygen  Post-op Pain: mild  Post-op Assessment: Post-op Vital signs reviewed and Patient's Cardiovascular Status Stable  Post-op Vital Signs: stable  Complications: No apparent anesthesia complications

## 2012-06-19 NOTE — Anesthesia Preprocedure Evaluation (Signed)
Anesthesia Evaluation  Patient identified by MRN, date of birth, ID band Patient awake    Reviewed: Allergy & Precautions, H&P , NPO status   Airway Mallampati: II TM Distance: >3 FB     Dental  (+) Edentulous Upper and Edentulous Lower   Pulmonary  breath sounds clear to auscultation        Cardiovascular Rhythm:Regular Rate:Normal     Neuro/Psych    GI/Hepatic   Endo/Other    Renal/GU      Musculoskeletal   Abdominal   Peds  Hematology   Anesthesia Other Findings   Reproductive/Obstetrics                           Anesthesia Physical Anesthesia Plan  ASA: III  Anesthesia Plan: General   Post-op Pain Management:    Induction: Intravenous  Airway Management Planned: LMA  Additional Equipment:   Intra-op Plan:   Post-operative Plan:   Informed Consent: I have reviewed the patients History and Physical, chart, labs and discussed the procedure including the risks, benefits and alternatives for the proposed anesthesia with the patient or authorized representative who has indicated his/her understanding and acceptance.     Plan Discussed with:   Anesthesia Plan Comments: (Htn  Plan GA with LMA  Kipp Brood, MD)        Anesthesia Quick Evaluation

## 2012-06-19 NOTE — Transfer of Care (Signed)
Immediate Anesthesia Transfer of Care Note  Patient: Gabriella Richards  Procedure(s) Performed: Procedure(s) (LRB): TENDON REPAIR (Right)  Patient Location: PACU  Anesthesia Type: General  Level of Consciousness: awake, alert , oriented and patient cooperative  Airway & Oxygen Therapy: Patient Spontanous Breathing and Patient connected to nasal cannula oxygen  Post-op Assessment: Report given to PACU RN, Post -op Vital signs reviewed and stable and Patient moving all extremities  Post vital signs: Reviewed and stable  Complications: No apparent anesthesia complications
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
## 2012-06-19 NOTE — Progress Notes (Signed)
R/B/A DISCUSSED WITH PT TODAY.  PT VOICED UNDERSTANDING OF PLAN CONSENT SIGNED DAY OF SURGERY PT SEEN AND EXAMINED PRIOR TO OPERATIVE PROCEDURE/DAY OF SURGERY SITE MARKED. QUESTIONS ANSWERED WILL RETURN TO TRAUMA SERVICE FOLLOWING SURGERY COULD GO HOME FROM HAND SURGERY STANDPOINT

## 2012-06-19 NOTE — Brief Op Note (Signed)
06/16/2012 - 06/19/2012  11:27 AM  PATIENT:  Gabriella Richards  51 y.o. female  PRE-OPERATIVE DIAGNOSIS:  RIGHT HAND LACERATION WITH TENDON INVOLVEMENT  POST-OPERATIVE DIAGNOSIS:RIGHT HAND LACERATION WITH TENDON INVOLVEMENT  PROCEDURE:  Procedure(s) (LRB): TENDON REPAIR (Right) HAND RIGHT INDEX AND LONG FINGERS EDC AND EIP  SURGEON:  Surgeon(s) and Role:    * Sharma Covert, MD - Primary  PHYSICIAN ASSISTANT: NONE  ASSISTANTS: none   ANESTHESIA:   general  EBL:  MINIMAL  BLOOD ADMINISTERED:none  DRAINS: none   LOCAL MEDICATIONS USED:  MARCAINE     SPECIMEN:  No Specimen  DISPOSITION OF SPECIMEN:  N/A  COUNTS:  YES  TOURNIQUET:  * Missing tourniquet times found for documented tourniquets in log:  16109 *  DICTATION: .[id number]  PLAN OF CARE: Admit to inpatient   PATIENT DISPOSITION:  PACU - hemodynamically stable.   Delay start of Pharmacological VTE agent (>24hrs) due to surgical blood loss or risk of bleeding: not applicable

## 2012-06-19 NOTE — Anesthesia Procedure Notes (Signed)
Procedure Name: LMA Insertion Date/Time: 06/19/2012 11:52 AM Performed by: Jerilee Hoh Pre-anesthesia Checklist: Patient identified, Emergency Drugs available, Suction available and Patient being monitored Patient Re-evaluated:Patient Re-evaluated prior to inductionOxygen Delivery Method: Circle system utilized Preoxygenation: Pre-oxygenation with 100% oxygen Intubation Type: IV induction LMA: LMA inserted LMA Size: 4.0 Tube type: Oral Number of attempts: 1 Placement Confirmation: breath sounds checked- equal and bilateral and positive ETCO2 Tube secured with: Tape Dental Injury: Teeth and Oropharynx as per pre-operative assessment

## 2012-06-19 NOTE — Preoperative (Signed)
Beta Blockers   Reason not to administer Beta Blockers:Not Applicable 
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
## 2012-06-20 NOTE — Progress Notes (Signed)
Physical Therapy Treatment Patient Details Name: Chirstina Haan MRN: [id number] DOB: [birth year] Today's Date: 06/20/2012 Time: 3244-0102 PT Time Calculation (min): 30 min  PT Assessment / Plan / Recommendation Comments on Treatment Session  Initiated stair training this session.  Pt reports its difficult to grasp RW with Rt hand due to splint.      Follow Up Recommendations  Home health PT;Supervision - Intermittent    Barriers to Discharge        Equipment Recommendations  Rolling walker with 5" wheels;3 in 1 bedside comode;Tub/shower bench    Recommendations for Other Services    Frequency Min 5X/week   Plan      Precautions / Restrictions Precautions Precautions: Fall Restrictions Weight Bearing Restrictions: Yes LLE Weight Bearing: Non weight bearing     Pertinent Vitals/Pain 8/10  LE's.  RN notified.      Mobility  Bed Mobility Bed Mobility: Supine to Sit Supine to Sit: 6: Modified independent (Device/Increase time);HOB flat Transfers Transfers: Sit to Stand;Stand to Sit Sit to Stand: 4: Min guard;With upper extremity assist;With armrests;From bed;From chair/3-in-1 Stand to Sit: 5: Supervision;With upper extremity assist;With armrests;To chair/3-in-1 Details for Transfer Assistance: Cues for safe hand placement Ambulation/Gait Ambulation/Gait Assistance: 5: Supervision Ambulation Distance (Feet): 30 Feet Assistive device: Rolling walker Gait Pattern:  ("Hop-to pattern") Stairs: Yes Stairs Assistance: 4: Min assist;3: Mod assist Stairs Assistance Details (indicate cue type and reason): Min (A) majority of time but required Mod (A) 2x's for balance due to poor foot placement on step & losing balance.  Pt with difficulty using Rt UE to grasp handle of RW due to splint.  Max cueing for technique.  Handout provided.   Stair Management Technique: No rails;Step to pattern;Backwards;With walker Number of Stairs: 2  (3x's) Wheelchair Mobility Wheelchair Mobility: No     PT Goals Acute Rehab PT Goals Time For Goal Achievement: 06/24/12 Potential to Achieve Goals: Good PT Goal: Supine/Side to Sit - Progress: Met PT Goal: Sit to Stand - Progress: Progressing toward goal PT Goal: Stand to Sit - Progress: Progressing toward goal PT Goal: Ambulate - Progress: Progressing toward goal PT Goal: Up/Down Stairs - Progress: Progressing toward goal  Visit Information  Last PT Received On: 06/20/12 Assistance Needed: +1    Subjective Data      Cognition  Overall Cognitive Status: Appears within functional limits for tasks assessed/performed Arousal/Alertness: Awake/alert Orientation Level: Appears intact for tasks assessed Behavior During Session: [facility] for tasks performed    Balance     End of Session PT - End of Session Equipment Utilized During Treatment: Gait belt Activity Tolerance: Patient tolerated treatment well Patient left: in chair;with call bell/phone within reach    [place], [place] [phone] 06/20/2012

## 2012-06-20 NOTE — Progress Notes (Signed)
POD #1 PT SEEN/EXAMINED NO ACUTE COMPLAINTS  RIGHT HAND: SPLINT C/D/INTACT WIGGLES DIGITS FINGERS WARM WELL PERFUSED  IMP: RIGHT HAND LACERATION WITH TENDON INVOLVEMENT  PLAN: CONTINUE WITH SPLINT OK TO GO HOME WILL NEED TO F/U WITH ME IN OFFICE IN 10-14 DAYS KEEP SPLINT ON AT ALL TIMES

## 2012-06-21 ENCOUNTER — Encounter (HOSPITAL_COMMUNITY): Payer: Self-pay | Admitting: Orthopedic Surgery

## 2012-06-21 ENCOUNTER — Inpatient Hospital Stay (HOSPITAL_COMMUNITY): Payer: Medicare Other

## 2012-06-21 DIAGNOSIS — S060X9A Concussion with loss of consciousness of unspecified duration, initial encounter: Secondary | ICD-10-CM | POA: Diagnosis present

## 2012-06-21 DIAGNOSIS — S56921A Laceration of unspecified muscles, fascia and tendons at forearm level, right arm, initial encounter: Secondary | ICD-10-CM

## 2012-06-21 DIAGNOSIS — D62 Acute posthemorrhagic anemia: Secondary | ICD-10-CM | POA: Diagnosis not present

## 2012-06-21 DIAGNOSIS — S51811A Laceration without foreign body of right forearm, initial encounter: Secondary | ICD-10-CM | POA: Diagnosis present

## 2012-06-21 HISTORY — DX: Laceration without foreign body of right forearm, initial encounter: S51.811A

## 2012-06-21 HISTORY — DX: Laceration of unspecified muscles, fascia and tendons at forearm level, right arm, initial encounter: S56.921A

## 2012-06-21 HISTORY — DX: Acute posthemorrhagic anemia: D62

## 2012-06-21 LAB — CBC
HCT: 35.9 % — ABNORMAL LOW (ref 36.0–46.0)
MCHC: 32.9 g/dL (ref 30.0–36.0)
MCV: 86.3 fL (ref 78.0–100.0)
RDW: 13.3 % (ref 11.5–15.5)

## 2012-06-21 MED ORDER — HYDROCODONE-ACETAMINOPHEN 10-325 MG PO TABS
0.5000 | ORAL_TABLET | Freq: Four times a day (QID) | ORAL | Status: DC | PRN
  Administered 2012-06-21: 2 via ORAL
  Filled 2012-06-21: qty 2

## 2012-06-21 MED ORDER — BACITRACIN-NEOMYCIN-POLYMYXIN 400-5-5000 EX OINT
TOPICAL_OINTMENT | CUTANEOUS | Status: AC
  Filled 2012-06-21: qty 1

## 2012-06-21 MED ORDER — BACITRACIN ZINC 500 UNIT/GM EX OINT
TOPICAL_OINTMENT | Freq: Two times a day (BID) | CUTANEOUS | Status: DC
  Administered 2012-06-21: 11:00:00 via TOPICAL
  Filled 2012-06-21 (×2): qty 15

## 2012-06-21 MED ORDER — HYDROMORPHONE HCL PF 1 MG/ML IJ SOLN: 0.5000 mg | INTRAMUSCULAR | Status: DC | PRN
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
## 2012-06-21 NOTE — Progress Notes (Signed)
Physical Therapy Treatment Patient Details Name: Gabriella Richards MRN: [id number] DOB: 08/08/1961 Today's Date: 06/21/2012 Time: 4098-1191 PT Time Calculation (min): 26 min  PT Assessment / Plan / Recommendation Comments on Treatment Session  OT has initiated getting pt platform for her RW (to ease ambulation with RUE). Plan is to practice stairs this afternoon WBAT with CAM walker per orders.     Follow Up Recommendations  Home health PT;Supervision - Intermittent    Barriers to Discharge        Equipment Recommendations  Rolling walker with 5" wheels;3 in 1 bedside comode    Recommendations for Other Services    Frequency     Plan Discharge plan remains appropriate;Frequency remains appropriate    Precautions / Restrictions Precautions Precautions: Fall Restrictions Weight Bearing Restrictions: Yes RUE Weight Bearing: Weight bear through elbow only LLE Weight Bearing: Non weight bearing       Mobility  Bed Mobility Supine to Sit: 6: Modified independent (Device/Increase time) Sitting - Scoot to Edge of Bed: 6: Modified independent (Device/Increase time) Transfers Transfers: Sit to Stand;Stand to Sit Sit to Stand: 4: Min assist;From bed;From chair/3-in-1 Stand to Sit: 4: Min guard;To chair/3-in-1;To bed;With upper extremity assist Details for Transfer Assistance: cues for safe hand placement and positioning for transfers (pt pulling on RW)  Ambulation/Gait Ambulation/Gait Assistance: 4: Min guard Ambulation Distance (Feet): 10 Feet (5 ft x2) Ambulation/Gait Assistance Details: good maintenance of NWB, 1 cue for safe sequence to ambulate to 3in1 Gait Pattern:  (hop to) Stairs: No    Exercises        PT Goals Acute Rehab PT Goals PT Goal: Supine/Side to Sit - Progress: Met PT Goal: Sit to Supine/Side - Progress: Met PT Goal: Sit to Stand - Progress: Progressing toward goal PT Goal: Stand to Sit - Progress: Progressing toward goal PT Transfer Goal: Bed to  Chair/Chair to Bed - Progress: Progressing toward goal PT Goal: Ambulate - Progress: Progressing toward goal PT Goal: Up/Down Stairs - Progress: Progressing toward goal  Visit Information  Last PT Received On: 06/21/12 Assistance Needed: +1    Subjective Data  Subjective: They keep calling me crazy.    Cognition  Orientation Level: Appears intact for tasks assessed Behavior During Session: Agitated Following Commands: Follows one step commands consistently Cognition - Other Comments: slower processing and garbled speech at times, very focused on people calling her confused    Balance     End of Session PT - End of Session Equipment Utilized During Treatment: Gait belt Activity Tolerance: Patient tolerated treatment well Patient left: in bed;with call bell/phone within reach;with bed alarm set   GP     [facility] HELEN 06/21/2012, 1:50 PM
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
## 2012-06-23 NOTE — ED Provider Notes (Signed)
Medical screening examination/treatment/procedure(s) were conducted as a shared visit with non-physician practitioner(s) and myself.  I personally evaluated the patient during the encounter.  Please see completed note for this encounter.  Raeford Razor, MD 06/23/12 1001

## 2012-06-24 ENCOUNTER — Ambulatory Visit (INDEPENDENT_AMBULATORY_CARE_PROVIDER_SITE_OTHER): Payer: Medicare Other | Admitting: Orthopedic Surgery

## 2012-06-24 ENCOUNTER — Encounter (INDEPENDENT_AMBULATORY_CARE_PROVIDER_SITE_OTHER): Payer: Self-pay

## 2012-06-24 VITALS — BP 140/74 | HR 72 | Temp 97.4°F | Resp 16 | Ht 61.0 in | Wt 150.0 lb

## 2012-06-24 DIAGNOSIS — S51012A Laceration without foreign body of left elbow, initial encounter: Secondary | ICD-10-CM

## 2012-06-24 DIAGNOSIS — S51009A Unspecified open wound of unspecified elbow, initial encounter: Secondary | ICD-10-CM
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
## 2012-07-08 ENCOUNTER — Encounter (INDEPENDENT_AMBULATORY_CARE_PROVIDER_SITE_OTHER): Payer: Self-pay

## 2012-07-08 ENCOUNTER — Ambulatory Visit (INDEPENDENT_AMBULATORY_CARE_PROVIDER_SITE_OTHER): Payer: Medicare Other | Admitting: Internal Medicine

## 2012-07-08 VITALS — BP 116/70 | HR 68 | Temp 97.1°F | Resp 14 | Ht 61.0 in | Wt 147.2 lb

## 2012-07-08 DIAGNOSIS — S51819A Laceration without foreign body of unspecified forearm, initial encounter: Secondary | ICD-10-CM

## 2012-07-08 DIAGNOSIS — S51809A Unspecified open wound of unspecified forearm, initial encounter: Secondary | ICD-10-CM

## 2012-07-08 NOTE — Patient Instructions (Signed)
Continue antibiotic cream and leave wound open to air Follow up as needed. Call with questions or concerns.
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
## 2012-10-11 DIAGNOSIS — H7010 Chronic mastoiditis, unspecified ear: Secondary | ICD-10-CM | POA: Insufficient documentation

## 2012-12-16 DIAGNOSIS — H7120 Cholesteatoma of mastoid, unspecified ear: Secondary | ICD-10-CM | POA: Insufficient documentation

## 2012-12-16 HISTORY — DX: Cholesteatoma of mastoid, unspecified ear: H71.20
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
## 2015-09-02 DIAGNOSIS — R0602 Shortness of breath: Secondary | ICD-10-CM | POA: Insufficient documentation

## 2015-09-02 DIAGNOSIS — R55 Syncope and collapse: Secondary | ICD-10-CM | POA: Insufficient documentation

## 2015-09-25 DIAGNOSIS — R42 Dizziness and giddiness: Secondary | ICD-10-CM | POA: Insufficient documentation

## 2015-10-22 DIAGNOSIS — M810 Age-related osteoporosis without current pathological fracture: Secondary | ICD-10-CM | POA: Insufficient documentation

## 2016-04-14 DIAGNOSIS — Z01818 Encounter for other preprocedural examination: Secondary | ICD-10-CM | POA: Insufficient documentation

## 2020-03-04 DIAGNOSIS — Z87891 Personal history of nicotine dependence: Secondary | ICD-10-CM | POA: Insufficient documentation

## 2020-03-04 DIAGNOSIS — F329 Major depressive disorder, single episode, unspecified: Secondary | ICD-10-CM | POA: Insufficient documentation

## 2020-03-04 DIAGNOSIS — E782 Mixed hyperlipidemia: Secondary | ICD-10-CM | POA: Insufficient documentation

## 2020-03-04 DIAGNOSIS — J449 Chronic obstructive pulmonary disease, unspecified: Secondary | ICD-10-CM | POA: Insufficient documentation

## 2020-03-04 DIAGNOSIS — G4700 Insomnia, unspecified: Secondary | ICD-10-CM | POA: Insufficient documentation

## 2020-03-04 DIAGNOSIS — F411 Generalized anxiety disorder: Secondary | ICD-10-CM | POA: Insufficient documentation

## 2020-03-04 DIAGNOSIS — I700 Atherosclerosis of aorta: Secondary | ICD-10-CM | POA: Insufficient documentation

## 2020-03-04 DIAGNOSIS — D3501 Benign neoplasm of right adrenal gland: Secondary | ICD-10-CM | POA: Insufficient documentation

## 2021-01-21 ENCOUNTER — Ambulatory Visit: Payer: Medicare Other | Admitting: Podiatry

## 2021-02-04 ENCOUNTER — Ambulatory Visit (INDEPENDENT_AMBULATORY_CARE_PROVIDER_SITE_OTHER): Payer: Medicare Other | Admitting: Podiatry

## 2021-02-04 DIAGNOSIS — Z5329 Procedure and treatment not carried out because of patient's decision for other reasons: Secondary | ICD-10-CM

## 2021-02-04 NOTE — Progress Notes (Signed)
No show for appt. 

## 2021-02-25 ENCOUNTER — Other Ambulatory Visit: Payer: Self-pay | Admitting: Podiatry

## 2021-02-25 ENCOUNTER — Other Ambulatory Visit: Payer: Self-pay

## 2021-02-25 ENCOUNTER — Ambulatory Visit (INDEPENDENT_AMBULATORY_CARE_PROVIDER_SITE_OTHER): Payer: Medicare Other | Admitting: Podiatry

## 2021-02-25 ENCOUNTER — Ambulatory Visit (INDEPENDENT_AMBULATORY_CARE_PROVIDER_SITE_OTHER): Payer: Medicare Other

## 2021-02-25 DIAGNOSIS — M79671 Pain in right foot: Secondary | ICD-10-CM

## 2021-02-25 DIAGNOSIS — M898X7 Other specified disorders of bone, ankle and foot: Secondary | ICD-10-CM

## 2021-02-25 DIAGNOSIS — G5763 Lesion of plantar nerve, bilateral lower limbs: Secondary | ICD-10-CM

## 2021-02-25 DIAGNOSIS — M79672 Pain in left foot: Secondary | ICD-10-CM

## 2021-02-25 MED ORDER — BETAMETHASONE SOD PHOS & ACET 6 (3-3) MG/ML IJ SUSP
6.0000 mg | Freq: Once | INTRAMUSCULAR | Status: AC
  Administered 2021-02-25: 6 mg

## 2021-02-25 NOTE — Progress Notes (Signed)
  Subjective:  Patient ID: Gabriella Richards, female    DOB: 05-27-1961,  MRN: [id number]  Chief Complaint  Patient presents with  . Pain    BL plantar forefoot pain (Rt>Lt) x 2 yrs; worse after dz with DM -no injury - 10/10 contanstant pain and burning -worse with standing tx: epsom salt soaking    60 y.o. female presents with the above complaint. History confirmed with patient.   Objective:  Physical Exam: warm, good capillary refill, no trophic changes or ulcerative lesions, normal DP and PT pulses and normal sensory exam. Left Foot: tenderness between the 3rd and 4th metatarsal head, Mulder's click present Right Foot: tenderness between the 3rd and 4th metatarsal head Mulder's click present  No images are attached to the encounter.  Radiographs: X-ray of both feet: no fracture, dislocation, swelling or degenerative changes noted and hallux valgus deformity Assessment:   1. Morton's neuroma of both feet   2. Pain in metatarsus of both feet    Plan:  Patient was evaluated and treated and all questions answered.  Morton Neuroma -Educated on etiology -Educated on padding and proper shoegear -XR reviewed with patient -Injection delivered to the affected interspaces  Procedure: Neuroma Injection Location: Bilateral 3rd interspace Skin Prep: Alcohol. Injectate: 0.5 cc 0.5% marcaine plain, 0.5 cc betamethasone acetate-betamethasone sodium phosphate Disposition: Patient tolerated procedure well. Injection site dressed with a band-aid.  No follow-ups on file.

## 2021-02-26 ENCOUNTER — Other Ambulatory Visit: Payer: Self-pay | Admitting: Podiatry

## 2021-02-26 DIAGNOSIS — G5763 Lesion of plantar nerve, bilateral lower limbs: Secondary | ICD-10-CM

## 2021-03-28 ENCOUNTER — Ambulatory Visit (INDEPENDENT_AMBULATORY_CARE_PROVIDER_SITE_OTHER): Payer: Medicare Other | Admitting: Podiatry

## 2021-03-28 ENCOUNTER — Other Ambulatory Visit: Payer: Self-pay

## 2021-03-28 DIAGNOSIS — M722 Plantar fascial fibromatosis: Secondary | ICD-10-CM

## 2021-03-28 DIAGNOSIS — G5763 Lesion of plantar nerve, bilateral lower limbs: Secondary | ICD-10-CM

## 2021-03-28 MED ORDER — BETAMETHASONE SOD PHOS & ACET 6 (3-3) MG/ML IJ SUSP
6.0000 mg | Freq: Once | INTRAMUSCULAR | Status: AC
  Administered 2021-03-28: 6 mg

## 2021-03-28 NOTE — Progress Notes (Signed)
  Subjective:  Patient ID: Gabriella Richards, female    DOB: November 18, 1961,  MRN: [id number]  Chief Complaint  Patient presents with  . Neuroma    F/U BL neuroma -pt states," it was a little better for about a week, after the week I can't walk on my Rt. It's been hurting more; 10/10." - no swelling -worse with standing Tx: none    60 y.o. female presents with the above complaint. History confirmed with patient.   Objective:  Physical Exam: warm, good capillary refill, no trophic changes or ulcerative lesions, normal DP and PT pulses and normal sensory exam. Left Foot: tenderness between the 3rd and 4th metatarsal head, Mulder's click present Right Foot: tenderness between the 3rd and 4th metatarsal head Mulder's click present  No images are attached to the encounter.  Radiographs: X-ray of both feet: no fracture, dislocation, swelling or degenerative changes noted and hallux valgus deformity Assessment:   1. Morton's neuroma of both feet   2. Plantar fasciitis    Plan:  Patient was evaluated and treated and all questions answered.  Morton Neuroma -Repeat injections today  Procedure: Neuroma Injection Location: Bilateral 3rd interspace Skin Prep: Alcohol. Injectate: 0.5 cc 0.5% marcaine plain, 0.5 cc betamethasone acetate-betamethasone sodium phosphate Disposition: Patient tolerated procedure well. Injection site dressed with a band-aid.   Plantar Fasciitis -Educated patient on stretching and icing of the affected limb -Plantar fascial brace dispensed   Return in about 3 weeks (around 04/18/2021) for Neuroma.

## 2021-03-28 NOTE — Patient Instructions (Signed)

## 2021-04-18 ENCOUNTER — Ambulatory Visit (INDEPENDENT_AMBULATORY_CARE_PROVIDER_SITE_OTHER): Payer: Medicare Other | Admitting: Podiatry

## 2021-04-18 ENCOUNTER — Other Ambulatory Visit: Payer: Self-pay

## 2021-04-18 ENCOUNTER — Encounter: Payer: Self-pay | Admitting: Podiatry

## 2021-04-18 DIAGNOSIS — G5763 Lesion of plantar nerve, bilateral lower limbs: Secondary | ICD-10-CM | POA: Diagnosis not present

## 2021-04-18 NOTE — Progress Notes (Signed)
  Subjective:  Patient ID: ENIOLA CERULLO, female    DOB: [birth year],  MRN: [id number]  Chief Complaint  Patient presents with  . Plantar Fasciitis    The right heel is cold and frozen feeling   . Neuroma    The injections only lasted for about a week and still hurts in the ball of the foot   60 y.o. female presents with the above complaint. History confirmed with patient.   Objective:  Physical Exam: warm, good capillary refill, no trophic changes or ulcerative lesions, normal DP and PT pulses and normal sensory exam. Left Foot: tenderness between the 3rd and 4th metatarsal head, Mulder's click present Right Foot: tenderness between the 3rd and 4th metatarsal head Mulder's click present  Assessment:   1. Morton's neuroma of both feet    Plan:  Patient was evaluated and treated and all questions answered.  Morton Neuroma -Discussed repeat injections vs excision of neuromas. Patient prefers injections. Discussed alcohol based injection at this point. Will start sclerosing alcohol injections  Procedure: Neurolysis Location: Bilateral 3rd interspace Skin Prep: Alcohol. Injectate: 4% alcohol sclerosing injection. Disposition: Patient tolerated procedure well. Injection site dressed with a band-aid.   Return in about 2 weeks (around 05/02/2021) for sclerosing alcohol injections.

## 2021-05-02 ENCOUNTER — Ambulatory Visit: Payer: Medicare Other | Admitting: Podiatry

## 2021-06-14 DIAGNOSIS — Z981 Arthrodesis status: Secondary | ICD-10-CM | POA: Diagnosis not present

## 2021-06-14 DIAGNOSIS — M5412 Radiculopathy, cervical region: Secondary | ICD-10-CM | POA: Diagnosis not present

## 2021-06-20 DIAGNOSIS — M6281 Muscle weakness (generalized): Secondary | ICD-10-CM | POA: Diagnosis not present

## 2021-06-20 DIAGNOSIS — M4323 Fusion of spine, cervicothoracic region: Secondary | ICD-10-CM | POA: Diagnosis not present

## 2021-06-20 DIAGNOSIS — M542 Cervicalgia: Secondary | ICD-10-CM | POA: Diagnosis not present

## 2021-06-24 DIAGNOSIS — M542 Cervicalgia: Secondary | ICD-10-CM | POA: Diagnosis not present

## 2021-06-24 DIAGNOSIS — M6281 Muscle weakness (generalized): Secondary | ICD-10-CM | POA: Diagnosis not present

## 2021-06-24 DIAGNOSIS — M4323 Fusion of spine, cervicothoracic region: Secondary | ICD-10-CM | POA: Diagnosis not present

## 2021-06-28 DIAGNOSIS — M4323 Fusion of spine, cervicothoracic region: Secondary | ICD-10-CM | POA: Diagnosis not present

## 2021-06-28 DIAGNOSIS — M6281 Muscle weakness (generalized): Secondary | ICD-10-CM | POA: Diagnosis not present

## 2021-06-28 DIAGNOSIS — M542 Cervicalgia: Secondary | ICD-10-CM | POA: Diagnosis not present

## 2021-07-08 DIAGNOSIS — M542 Cervicalgia: Secondary | ICD-10-CM | POA: Diagnosis not present

## 2021-07-08 DIAGNOSIS — M4323 Fusion of spine, cervicothoracic region: Secondary | ICD-10-CM | POA: Diagnosis not present

## 2021-07-08 DIAGNOSIS — M6281 Muscle weakness (generalized): Secondary | ICD-10-CM | POA: Diagnosis not present

## 2021-07-12 DIAGNOSIS — M542 Cervicalgia: Secondary | ICD-10-CM | POA: Diagnosis not present

## 2021-07-12 DIAGNOSIS — M6281 Muscle weakness (generalized): Secondary | ICD-10-CM | POA: Diagnosis not present

## 2021-07-12 DIAGNOSIS — M4323 Fusion of spine, cervicothoracic region: Secondary | ICD-10-CM | POA: Diagnosis not present

## 2021-07-14 DIAGNOSIS — M5416 Radiculopathy, lumbar region: Secondary | ICD-10-CM | POA: Diagnosis not present

## 2021-07-14 DIAGNOSIS — M545 Low back pain, unspecified: Secondary | ICD-10-CM | POA: Diagnosis not present

## 2021-07-16 DIAGNOSIS — L989 Disorder of the skin and subcutaneous tissue, unspecified: Secondary | ICD-10-CM | POA: Diagnosis not present

## 2021-07-16 DIAGNOSIS — J449 Chronic obstructive pulmonary disease, unspecified: Secondary | ICD-10-CM | POA: Diagnosis not present

## 2021-07-16 DIAGNOSIS — I10 Essential (primary) hypertension: Secondary | ICD-10-CM | POA: Diagnosis not present

## 2021-07-16 DIAGNOSIS — I700 Atherosclerosis of aorta: Secondary | ICD-10-CM | POA: Diagnosis not present

## 2021-07-16 DIAGNOSIS — E785 Hyperlipidemia, unspecified: Secondary | ICD-10-CM | POA: Diagnosis not present

## 2021-07-16 DIAGNOSIS — G629 Polyneuropathy, unspecified: Secondary | ICD-10-CM | POA: Diagnosis not present

## 2021-07-16 DIAGNOSIS — E782 Mixed hyperlipidemia: Secondary | ICD-10-CM | POA: Diagnosis not present

## 2021-07-16 DIAGNOSIS — E1169 Type 2 diabetes mellitus with other specified complication: Secondary | ICD-10-CM | POA: Diagnosis not present

## 2021-07-19 DIAGNOSIS — M6281 Muscle weakness (generalized): Secondary | ICD-10-CM | POA: Diagnosis not present

## 2021-07-19 DIAGNOSIS — M542 Cervicalgia: Secondary | ICD-10-CM | POA: Diagnosis not present

## 2021-07-19 DIAGNOSIS — M4323 Fusion of spine, cervicothoracic region: Secondary | ICD-10-CM | POA: Diagnosis not present

## 2021-07-23 DIAGNOSIS — M5416 Radiculopathy, lumbar region: Secondary | ICD-10-CM | POA: Diagnosis not present

## 2021-07-23 DIAGNOSIS — M545 Low back pain, unspecified: Secondary | ICD-10-CM | POA: Diagnosis not present

## 2021-07-26 DIAGNOSIS — M545 Low back pain, unspecified: Secondary | ICD-10-CM | POA: Diagnosis not present

## 2021-07-26 DIAGNOSIS — M5416 Radiculopathy, lumbar region: Secondary | ICD-10-CM | POA: Diagnosis not present

## 2021-08-06 DIAGNOSIS — M546 Pain in thoracic spine: Secondary | ICD-10-CM | POA: Diagnosis not present

## 2021-08-06 DIAGNOSIS — M4323 Fusion of spine, cervicothoracic region: Secondary | ICD-10-CM | POA: Diagnosis not present

## 2021-08-06 DIAGNOSIS — M6281 Muscle weakness (generalized): Secondary | ICD-10-CM | POA: Diagnosis not present

## 2021-08-06 DIAGNOSIS — M542 Cervicalgia: Secondary | ICD-10-CM | POA: Diagnosis not present

## 2021-08-08 DIAGNOSIS — M6281 Muscle weakness (generalized): Secondary | ICD-10-CM | POA: Diagnosis not present

## 2021-08-08 DIAGNOSIS — M542 Cervicalgia: Secondary | ICD-10-CM | POA: Diagnosis not present

## 2021-08-08 DIAGNOSIS — M4323 Fusion of spine, cervicothoracic region: Secondary | ICD-10-CM | POA: Diagnosis not present

## 2021-08-08 DIAGNOSIS — M546 Pain in thoracic spine: Secondary | ICD-10-CM | POA: Diagnosis not present

## 2021-08-13 DIAGNOSIS — M48061 Spinal stenosis, lumbar region without neurogenic claudication: Secondary | ICD-10-CM | POA: Diagnosis not present

## 2021-08-13 DIAGNOSIS — M5126 Other intervertebral disc displacement, lumbar region: Secondary | ICD-10-CM | POA: Diagnosis not present

## 2021-08-13 DIAGNOSIS — M5416 Radiculopathy, lumbar region: Secondary | ICD-10-CM | POA: Diagnosis not present

## 2021-08-22 DIAGNOSIS — M542 Cervicalgia: Secondary | ICD-10-CM | POA: Diagnosis not present

## 2021-08-22 DIAGNOSIS — M546 Pain in thoracic spine: Secondary | ICD-10-CM | POA: Diagnosis not present

## 2021-08-22 DIAGNOSIS — M6281 Muscle weakness (generalized): Secondary | ICD-10-CM | POA: Diagnosis not present

## 2021-08-22 DIAGNOSIS — M4323 Fusion of spine, cervicothoracic region: Secondary | ICD-10-CM | POA: Diagnosis not present

## 2021-08-29 DIAGNOSIS — M546 Pain in thoracic spine: Secondary | ICD-10-CM | POA: Diagnosis not present

## 2021-08-29 DIAGNOSIS — M4323 Fusion of spine, cervicothoracic region: Secondary | ICD-10-CM | POA: Diagnosis not present

## 2021-08-29 DIAGNOSIS — M542 Cervicalgia: Secondary | ICD-10-CM | POA: Diagnosis not present

## 2021-08-29 DIAGNOSIS — M6281 Muscle weakness (generalized): Secondary | ICD-10-CM | POA: Diagnosis not present

## 2021-09-03 DIAGNOSIS — M542 Cervicalgia: Secondary | ICD-10-CM | POA: Diagnosis not present

## 2021-09-03 DIAGNOSIS — M4323 Fusion of spine, cervicothoracic region: Secondary | ICD-10-CM | POA: Diagnosis not present

## 2021-09-03 DIAGNOSIS — M6281 Muscle weakness (generalized): Secondary | ICD-10-CM | POA: Diagnosis not present

## 2021-09-03 DIAGNOSIS — M546 Pain in thoracic spine: Secondary | ICD-10-CM | POA: Diagnosis not present

## 2021-09-05 DIAGNOSIS — M6281 Muscle weakness (generalized): Secondary | ICD-10-CM | POA: Diagnosis not present

## 2021-09-05 DIAGNOSIS — M542 Cervicalgia: Secondary | ICD-10-CM | POA: Diagnosis not present

## 2021-09-05 DIAGNOSIS — M546 Pain in thoracic spine: Secondary | ICD-10-CM | POA: Diagnosis not present

## 2021-09-05 DIAGNOSIS — M4323 Fusion of spine, cervicothoracic region: Secondary | ICD-10-CM | POA: Diagnosis not present

## 2021-09-10 DIAGNOSIS — M546 Pain in thoracic spine: Secondary | ICD-10-CM | POA: Diagnosis not present

## 2021-09-10 DIAGNOSIS — M542 Cervicalgia: Secondary | ICD-10-CM | POA: Diagnosis not present

## 2021-09-10 DIAGNOSIS — M4323 Fusion of spine, cervicothoracic region: Secondary | ICD-10-CM | POA: Diagnosis not present

## 2021-09-10 DIAGNOSIS — M6281 Muscle weakness (generalized): Secondary | ICD-10-CM | POA: Diagnosis not present

## 2021-09-12 DIAGNOSIS — M546 Pain in thoracic spine: Secondary | ICD-10-CM | POA: Diagnosis not present

## 2021-09-12 DIAGNOSIS — M542 Cervicalgia: Secondary | ICD-10-CM | POA: Diagnosis not present

## 2021-09-12 DIAGNOSIS — M6281 Muscle weakness (generalized): Secondary | ICD-10-CM | POA: Diagnosis not present

## 2021-09-12 DIAGNOSIS — M4323 Fusion of spine, cervicothoracic region: Secondary | ICD-10-CM | POA: Diagnosis not present

## 2021-09-19 DIAGNOSIS — M6281 Muscle weakness (generalized): Secondary | ICD-10-CM | POA: Diagnosis not present

## 2021-09-19 DIAGNOSIS — M4323 Fusion of spine, cervicothoracic region: Secondary | ICD-10-CM | POA: Diagnosis not present

## 2021-09-19 DIAGNOSIS — M542 Cervicalgia: Secondary | ICD-10-CM | POA: Diagnosis not present

## 2021-09-19 DIAGNOSIS — M546 Pain in thoracic spine: Secondary | ICD-10-CM | POA: Diagnosis not present

## 2021-09-27 DIAGNOSIS — M5416 Radiculopathy, lumbar region: Secondary | ICD-10-CM | POA: Diagnosis not present

## 2021-09-27 DIAGNOSIS — M545 Low back pain, unspecified: Secondary | ICD-10-CM | POA: Diagnosis not present

## 2021-10-13 DIAGNOSIS — G8929 Other chronic pain: Secondary | ICD-10-CM | POA: Diagnosis not present

## 2021-10-13 DIAGNOSIS — M545 Low back pain, unspecified: Secondary | ICD-10-CM | POA: Diagnosis not present

## 2021-10-17 ENCOUNTER — Other Ambulatory Visit: Payer: Self-pay

## 2021-10-17 ENCOUNTER — Ambulatory Visit (INDEPENDENT_AMBULATORY_CARE_PROVIDER_SITE_OTHER): Payer: Medicare Other | Admitting: Family

## 2021-10-17 ENCOUNTER — Encounter: Payer: Self-pay | Admitting: Family

## 2021-10-17 VITALS — BP 163/91 | HR 80 | Temp 97.7°F | Ht 61.0 in | Wt 149.8 lb

## 2021-10-17 DIAGNOSIS — M5441 Lumbago with sciatica, right side: Secondary | ICD-10-CM

## 2021-10-17 DIAGNOSIS — E782 Mixed hyperlipidemia: Secondary | ICD-10-CM

## 2021-10-17 DIAGNOSIS — I10 Essential (primary) hypertension: Secondary | ICD-10-CM | POA: Diagnosis not present

## 2021-10-17 DIAGNOSIS — F99 Mental disorder, not otherwise specified: Secondary | ICD-10-CM

## 2021-10-17 DIAGNOSIS — E1140 Type 2 diabetes mellitus with diabetic neuropathy, unspecified: Secondary | ICD-10-CM | POA: Diagnosis not present

## 2021-10-17 DIAGNOSIS — F411 Generalized anxiety disorder: Secondary | ICD-10-CM

## 2021-10-17 DIAGNOSIS — J449 Chronic obstructive pulmonary disease, unspecified: Secondary | ICD-10-CM

## 2021-10-17 DIAGNOSIS — G8929 Other chronic pain: Secondary | ICD-10-CM

## 2021-10-17 DIAGNOSIS — R110 Nausea: Secondary | ICD-10-CM

## 2021-10-17 DIAGNOSIS — I700 Atherosclerosis of aorta: Secondary | ICD-10-CM

## 2021-10-17 DIAGNOSIS — F5105 Insomnia due to other mental disorder: Secondary | ICD-10-CM

## 2021-10-17 DIAGNOSIS — F331 Major depressive disorder, recurrent, moderate: Secondary | ICD-10-CM

## 2021-10-17 LAB — LIPID PANEL
Cholesterol: 137 mg/dL (ref 0–200)
HDL: 31.8 mg/dL — ABNORMAL LOW (ref >39.00)
NonHDL: 105.38
Total CHOL/HDL Ratio: 4
Triglycerides: 263 mg/dL — ABNORMAL HIGH (ref 0.0–149.0)
VLDL: 52.6 mg/dL — ABNORMAL HIGH (ref 0.0–40.0)

## 2021-10-17 LAB — COMPREHENSIVE METABOLIC PANEL
ALT: 15 U/L (ref 0–35)
AST: 17 U/L (ref 0–37)
Albumin: 4.2 g/dL (ref 3.5–5.2)
Alkaline Phosphatase: 65 U/L (ref 39–117)
BUN: 31 mg/dL — ABNORMAL HIGH (ref 6–23)
CO2: 35 mEq/L — ABNORMAL HIGH (ref 19–32)
Calcium: 10.3 mg/dL (ref 8.4–10.5)
Chloride: 103 mEq/L (ref 96–112)
Creatinine, Ser: 1.02 mg/dL (ref 0.40–1.20)
GFR: 59.12 mL/min — ABNORMAL LOW (ref >60.00)
Glucose, Bld: 106 mg/dL — ABNORMAL HIGH (ref 70–99)
Potassium: 5.5 mEq/L — ABNORMAL HIGH (ref 3.5–5.1)
Sodium: 142 mEq/L (ref 135–145)
Total Bilirubin: 0.6 mg/dL (ref 0.2–1.2)
Total Protein: 7.1 g/dL (ref 6.0–8.3)

## 2021-10-17 LAB — HEMOGLOBIN A1C: Hgb A1c MFr Bld: 6 % (ref 4.6–6.5)

## 2021-10-17 LAB — LDL CHOLESTEROL, DIRECT: Direct LDL: 83 mg/dL

## 2021-10-17 MED ORDER — PREGABALIN 150 MG PO CAPS
150.0000 mg | ORAL_CAPSULE | Freq: Three times a day (TID) | ORAL | 2 refills | Status: DC
Start: 2021-10-17 — End: 2022-07-15

## 2021-10-17 MED ORDER — CLONAZEPAM 0.5 MG PO TABS: 0.5000 mg | ORAL_TABLET | Freq: Every day | ORAL | 2 refills | Status: DC | PRN

## 2021-10-17 MED ORDER — GLIPIZIDE ER 2.5 MG PO TB24: 2.5000 mg | ORAL_TABLET | Freq: Every day | ORAL | 0 refills | Status: AC

## 2021-10-17 MED ORDER — OMEPRAZOLE 20 MG PO CPDR: 20.0000 mg | DELAYED_RELEASE_CAPSULE | Freq: Every day | ORAL | 0 refills | Status: AC

## 2021-10-17 MED ORDER — ONDANSETRON HCL 4 MG PO TABS: 4.0000 mg | ORAL_TABLET | Freq: Three times a day (TID) | ORAL | 0 refills | Status: AC | PRN

## 2021-10-17 MED ORDER — ZOLPIDEM TARTRATE ER 12.5 MG PO TBCR: 12.5000 mg | EXTENDED_RELEASE_TABLET | Freq: Every evening | ORAL | 2 refills | Status: AC | PRN

## 2021-10-17 NOTE — Patient Instructions (Addendum)
Welcome to [facility]! It was a pleasure seeing you today.  Go to the lab for blood work today, I will let you know the results vis MyChart, please sign up ASAP. If not signed up in time, we will call you with the results. As discussed, Please schedule a 3 month follow up today, and we can see you sooner for any concerns if needed. Your refills have been sent to your pharmacy.   PLEASE NOTE:  If you had any LAB tests please let us know if you have not heard back within a few days. You may see your results on MyChart before we have a chance to review them but we will give you a call once they are reviewed by [place]. If we ordered any REFERRALS today, please let us know if you have not heard from their office within the next week.  Let us know through MyChart if you are needing REFILLS, or have your pharmacy send [place] the request. You can also use MyChart to communicate with me or any office staff.  Please try these tips to maintain a healthy lifestyle:  Eat most of your calories during the day when you are active. Eliminate processed foods including packaged sweets (pies, cakes, cookies), reduce intake of potatoes, white bread, white pasta, and white rice. Look for whole grain options, oat flour or almond flour.  Each meal should contain half fruits/vegetables, one quarter protein, and one quarter carbs (no bigger than a computer mouse).  Cut down on sweet beverages. This includes juice, soda, and sweet tea. Also watch fruit intake, though this is a healthier sweet option, it still contains natural sugar! Limit to 3 servings daily.  Drink at least 1 glass of water with each meal and aim for at least 8 glasses per day  Exercise at least 150 minutes every week.
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
## 2021-10-18 NOTE — Assessment & Plan Note (Addendum)
Stable on Symbicort

## 2021-10-18 NOTE — Assessment & Plan Note (Signed)
Stable on Lexapro and Klonopin, prn

## 2021-10-18 NOTE — Assessment & Plan Note (Signed)
Lexapro, pt still not at goal, waiting on previous records to review old meds tried/failed in past. Continue to encourage counseling.

## 2021-10-18 NOTE — Assessment & Plan Note (Signed)
Did not tolerate Pravastatin. Doing well on Livalo.

## 2021-10-18 NOTE — Assessment & Plan Note (Signed)
Chronic, off & on, hx of GERD, does not always take PPI as directed.

## 2021-10-18 NOTE — Assessment & Plan Note (Signed)
Found on CT scan

## 2021-10-18 NOTE — Assessment & Plan Note (Signed)
Stable on Losartan-HCTZ and Amlodipine

## 2021-10-18 NOTE — Assessment & Plan Note (Signed)
Recent ER visit due to pain, wanting to see different pain clinic, is trying to [facility]establish with [facility] in [place]. Finishing up pred taper pack,  Advised to not also take Mobic while taking the prednisone.

## 2021-10-18 NOTE — Assessment & Plan Note (Signed)
Stable on Ambien

## 2021-10-21 NOTE — Progress Notes (Signed)
Let Gabriella Richards know her labs look ok, except her kidney function is a little low, appears somewhat dehydrated, remind her to drink at least 2 liters of water daily.   Her potassium level is too high - this should really be rechecked. Potassium has to be in normal range for good heart function.  I would recommend she have a lab only appointment to recheck. She lives in [place] and transportation is a barrier for her, is there another lab she can go to closer to her? At the hospital? Another [facility] office or stand alone [facility] lab?   Let me know if I need to send order elsewhere.  Thanks.

## 2021-10-22 ENCOUNTER — Other Ambulatory Visit: Payer: Self-pay

## 2021-10-22 ENCOUNTER — Telehealth: Payer: Self-pay

## 2021-10-22 DIAGNOSIS — E875 Hyperkalemia: Secondary | ICD-10-CM

## 2021-10-22 NOTE — Addendum Note (Signed)
Addended by: Jerrel Ivory D on: 10/22/2021 02:17 PM   Modules accepted: Orders

## 2021-10-22 NOTE — Telephone Encounter (Signed)
Error

## 2021-11-04 ENCOUNTER — Encounter: Payer: Self-pay | Admitting: Family

## 2021-11-04 ENCOUNTER — Telehealth (INDEPENDENT_AMBULATORY_CARE_PROVIDER_SITE_OTHER): Payer: Medicare Other | Admitting: Family

## 2021-11-04 VITALS — Ht 61.0 in | Wt 149.7 lb

## 2021-11-04 DIAGNOSIS — U071 COVID-19: Secondary | ICD-10-CM | POA: Diagnosis not present

## 2021-11-04 MED ORDER — MOLNUPIRAVIR EUA 200MG CAPSULE: 4.0000 | ORAL_CAPSULE | Freq: Two times a day (BID) | ORAL | 0 refills | Status: AC
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
## 2021-11-04 NOTE — Assessment & Plan Note (Addendum)
Sending Molnupiravir, stressed importance of getting 2 doses in today. Ok to take Tylenol or Ibuprofen, OTC antihistamine prn. Advised of CDC guidelines for self isolation/ ending isolation.  Advised of safe practice guidelines. Symptom Tier reviewed.  Encouraged to monitor for any worsening symptoms; watch for increased shortness of breath, weakness, and signs of dehydration. Advised when to seek emergency care.  Instructed to rest and hydrate well.  Advised to leave the house during recommended isolation period, only if it is necessary to seek medical care

## 2021-11-14 DIAGNOSIS — M549 Dorsalgia, unspecified: Secondary | ICD-10-CM | POA: Diagnosis not present

## 2021-11-14 DIAGNOSIS — G894 Chronic pain syndrome: Secondary | ICD-10-CM | POA: Diagnosis not present

## 2021-11-14 DIAGNOSIS — Z79899 Other long term (current) drug therapy: Secondary | ICD-10-CM | POA: Diagnosis not present

## 2021-11-14 DIAGNOSIS — M4302 Spondylolysis, cervical region: Secondary | ICD-10-CM | POA: Diagnosis not present

## 2021-11-14 DIAGNOSIS — M4306 Spondylolysis, lumbar region: Secondary | ICD-10-CM | POA: Diagnosis not present

## 2021-11-14 DIAGNOSIS — Z1389 Encounter for screening for other disorder: Secondary | ICD-10-CM | POA: Diagnosis not present

## 2021-12-04 DIAGNOSIS — M159 Polyosteoarthritis, unspecified: Secondary | ICD-10-CM | POA: Diagnosis not present

## 2021-12-04 DIAGNOSIS — D3501 Benign neoplasm of right adrenal gland: Secondary | ICD-10-CM | POA: Diagnosis not present

## 2021-12-04 DIAGNOSIS — M542 Cervicalgia: Secondary | ICD-10-CM | POA: Diagnosis not present

## 2021-12-04 DIAGNOSIS — I10 Essential (primary) hypertension: Secondary | ICD-10-CM | POA: Diagnosis not present

## 2021-12-04 DIAGNOSIS — G629 Polyneuropathy, unspecified: Secondary | ICD-10-CM | POA: Diagnosis not present

## 2021-12-04 DIAGNOSIS — G8929 Other chronic pain: Secondary | ICD-10-CM | POA: Diagnosis not present

## 2021-12-04 DIAGNOSIS — G4700 Insomnia, unspecified: Secondary | ICD-10-CM | POA: Diagnosis not present

## 2021-12-04 DIAGNOSIS — J449 Chronic obstructive pulmonary disease, unspecified: Secondary | ICD-10-CM | POA: Diagnosis not present

## 2021-12-04 DIAGNOSIS — Z87891 Personal history of nicotine dependence: Secondary | ICD-10-CM | POA: Diagnosis not present

## 2021-12-04 DIAGNOSIS — E782 Mixed hyperlipidemia: Secondary | ICD-10-CM | POA: Diagnosis not present

## 2022-01-02 DIAGNOSIS — E119 Type 2 diabetes mellitus without complications: Secondary | ICD-10-CM | POA: Diagnosis not present

## 2022-01-02 DIAGNOSIS — H2513 Age-related nuclear cataract, bilateral: Secondary | ICD-10-CM | POA: Diagnosis not present

## 2022-01-08 DIAGNOSIS — G8929 Other chronic pain: Secondary | ICD-10-CM | POA: Diagnosis not present

## 2022-01-08 DIAGNOSIS — I700 Atherosclerosis of aorta: Secondary | ICD-10-CM | POA: Diagnosis not present

## 2022-01-08 DIAGNOSIS — D3501 Benign neoplasm of right adrenal gland: Secondary | ICD-10-CM | POA: Diagnosis not present

## 2022-01-08 DIAGNOSIS — M159 Polyosteoarthritis, unspecified: Secondary | ICD-10-CM | POA: Diagnosis not present

## 2022-01-08 DIAGNOSIS — G4700 Insomnia, unspecified: Secondary | ICD-10-CM | POA: Diagnosis not present

## 2022-01-08 DIAGNOSIS — J449 Chronic obstructive pulmonary disease, unspecified: Secondary | ICD-10-CM | POA: Diagnosis not present

## 2022-01-08 DIAGNOSIS — E782 Mixed hyperlipidemia: Secondary | ICD-10-CM | POA: Diagnosis not present

## 2022-01-08 DIAGNOSIS — G629 Polyneuropathy, unspecified: Secondary | ICD-10-CM | POA: Diagnosis not present

## 2022-01-08 DIAGNOSIS — M542 Cervicalgia: Secondary | ICD-10-CM | POA: Diagnosis not present

## 2022-01-08 DIAGNOSIS — I10 Essential (primary) hypertension: Secondary | ICD-10-CM | POA: Diagnosis not present

## 2022-01-09 DIAGNOSIS — Z79891 Long term (current) use of opiate analgesic: Secondary | ICD-10-CM | POA: Diagnosis not present

## 2022-01-09 DIAGNOSIS — M4302 Spondylolysis, cervical region: Secondary | ICD-10-CM | POA: Diagnosis not present

## 2022-01-09 DIAGNOSIS — M5416 Radiculopathy, lumbar region: Secondary | ICD-10-CM | POA: Diagnosis not present

## 2022-01-09 DIAGNOSIS — Z1389 Encounter for screening for other disorder: Secondary | ICD-10-CM | POA: Diagnosis not present

## 2022-01-09 DIAGNOSIS — M4306 Spondylolysis, lumbar region: Secondary | ICD-10-CM | POA: Diagnosis not present

## 2022-01-09 DIAGNOSIS — M549 Dorsalgia, unspecified: Secondary | ICD-10-CM | POA: Diagnosis not present

## 2022-01-09 DIAGNOSIS — G894 Chronic pain syndrome: Secondary | ICD-10-CM | POA: Diagnosis not present

## 2022-01-23 ENCOUNTER — Ambulatory Visit: Payer: Medicare Other | Admitting: Family

## 2022-01-23 DIAGNOSIS — E782 Mixed hyperlipidemia: Secondary | ICD-10-CM | POA: Diagnosis not present

## 2022-01-23 DIAGNOSIS — J449 Chronic obstructive pulmonary disease, unspecified: Secondary | ICD-10-CM | POA: Diagnosis not present

## 2022-01-23 DIAGNOSIS — G629 Polyneuropathy, unspecified: Secondary | ICD-10-CM | POA: Diagnosis not present

## 2022-01-23 DIAGNOSIS — I700 Atherosclerosis of aorta: Secondary | ICD-10-CM | POA: Diagnosis not present

## 2022-01-23 DIAGNOSIS — M542 Cervicalgia: Secondary | ICD-10-CM | POA: Diagnosis not present

## 2022-01-23 DIAGNOSIS — D3501 Benign neoplasm of right adrenal gland: Secondary | ICD-10-CM | POA: Diagnosis not present

## 2022-01-23 DIAGNOSIS — G4700 Insomnia, unspecified: Secondary | ICD-10-CM | POA: Diagnosis not present

## 2022-01-23 DIAGNOSIS — I10 Essential (primary) hypertension: Secondary | ICD-10-CM | POA: Diagnosis not present

## 2022-01-23 DIAGNOSIS — M159 Polyosteoarthritis, unspecified: Secondary | ICD-10-CM | POA: Diagnosis not present

## 2022-01-23 DIAGNOSIS — G8929 Other chronic pain: Secondary | ICD-10-CM | POA: Diagnosis not present

## 2022-02-06 DIAGNOSIS — M4302 Spondylolysis, cervical region: Secondary | ICD-10-CM | POA: Diagnosis not present

## 2022-02-06 DIAGNOSIS — Z1389 Encounter for screening for other disorder: Secondary | ICD-10-CM | POA: Diagnosis not present

## 2022-02-06 DIAGNOSIS — Z79891 Long term (current) use of opiate analgesic: Secondary | ICD-10-CM | POA: Diagnosis not present

## 2022-02-06 DIAGNOSIS — G894 Chronic pain syndrome: Secondary | ICD-10-CM | POA: Diagnosis not present

## 2022-02-06 DIAGNOSIS — M4306 Spondylolysis, lumbar region: Secondary | ICD-10-CM | POA: Diagnosis not present

## 2022-02-07 DIAGNOSIS — G8929 Other chronic pain: Secondary | ICD-10-CM | POA: Diagnosis not present

## 2022-02-07 DIAGNOSIS — R110 Nausea: Secondary | ICD-10-CM | POA: Diagnosis not present

## 2022-02-07 DIAGNOSIS — G4700 Insomnia, unspecified: Secondary | ICD-10-CM | POA: Diagnosis not present

## 2022-02-07 DIAGNOSIS — I10 Essential (primary) hypertension: Secondary | ICD-10-CM | POA: Diagnosis not present

## 2022-02-07 DIAGNOSIS — M542 Cervicalgia: Secondary | ICD-10-CM | POA: Diagnosis not present

## 2022-03-06 DIAGNOSIS — M4302 Spondylolysis, cervical region: Secondary | ICD-10-CM | POA: Diagnosis not present

## 2022-03-06 DIAGNOSIS — Z1389 Encounter for screening for other disorder: Secondary | ICD-10-CM | POA: Diagnosis not present

## 2022-03-06 DIAGNOSIS — M549 Dorsalgia, unspecified: Secondary | ICD-10-CM | POA: Diagnosis not present

## 2022-03-06 DIAGNOSIS — M4306 Spondylolysis, lumbar region: Secondary | ICD-10-CM | POA: Diagnosis not present

## 2022-03-06 DIAGNOSIS — G894 Chronic pain syndrome: Secondary | ICD-10-CM | POA: Diagnosis not present

## 2022-03-06 DIAGNOSIS — Z79891 Long term (current) use of opiate analgesic: Secondary | ICD-10-CM | POA: Diagnosis not present

## 2022-03-13 DIAGNOSIS — I10 Essential (primary) hypertension: Secondary | ICD-10-CM | POA: Diagnosis not present

## 2022-03-13 DIAGNOSIS — M542 Cervicalgia: Secondary | ICD-10-CM | POA: Diagnosis not present

## 2022-03-13 DIAGNOSIS — G4700 Insomnia, unspecified: Secondary | ICD-10-CM | POA: Diagnosis not present

## 2022-03-13 DIAGNOSIS — K219 Gastro-esophageal reflux disease without esophagitis: Secondary | ICD-10-CM | POA: Diagnosis not present

## 2022-03-13 DIAGNOSIS — G8929 Other chronic pain: Secondary | ICD-10-CM | POA: Diagnosis not present

## 2022-03-13 DIAGNOSIS — R110 Nausea: Secondary | ICD-10-CM | POA: Diagnosis not present

## 2022-04-08 DIAGNOSIS — K219 Gastro-esophageal reflux disease without esophagitis: Secondary | ICD-10-CM | POA: Diagnosis not present

## 2022-04-08 DIAGNOSIS — G8929 Other chronic pain: Secondary | ICD-10-CM | POA: Diagnosis not present

## 2022-04-08 DIAGNOSIS — R229 Localized swelling, mass and lump, unspecified: Secondary | ICD-10-CM | POA: Diagnosis not present

## 2022-04-08 DIAGNOSIS — I10 Essential (primary) hypertension: Secondary | ICD-10-CM | POA: Diagnosis not present

## 2022-04-08 DIAGNOSIS — R110 Nausea: Secondary | ICD-10-CM | POA: Diagnosis not present

## 2022-04-08 DIAGNOSIS — M542 Cervicalgia: Secondary | ICD-10-CM | POA: Diagnosis not present

## 2022-04-08 DIAGNOSIS — G4700 Insomnia, unspecified: Secondary | ICD-10-CM | POA: Diagnosis not present

## 2022-04-10 DIAGNOSIS — Z1389 Encounter for screening for other disorder: Secondary | ICD-10-CM | POA: Diagnosis not present

## 2022-04-10 DIAGNOSIS — M4302 Spondylolysis, cervical region: Secondary | ICD-10-CM | POA: Diagnosis not present

## 2022-04-10 DIAGNOSIS — M4306 Spondylolysis, lumbar region: Secondary | ICD-10-CM | POA: Diagnosis not present

## 2022-04-10 DIAGNOSIS — G894 Chronic pain syndrome: Secondary | ICD-10-CM | POA: Diagnosis not present

## 2022-04-10 DIAGNOSIS — Z79891 Long term (current) use of opiate analgesic: Secondary | ICD-10-CM | POA: Diagnosis not present

## 2022-04-14 DIAGNOSIS — I10 Essential (primary) hypertension: Secondary | ICD-10-CM | POA: Diagnosis not present

## 2022-04-14 DIAGNOSIS — E860 Dehydration: Secondary | ICD-10-CM | POA: Diagnosis not present

## 2022-04-14 DIAGNOSIS — Z7984 Long term (current) use of oral hypoglycemic drugs: Secondary | ICD-10-CM | POA: Diagnosis not present

## 2022-04-14 DIAGNOSIS — R110 Nausea: Secondary | ICD-10-CM | POA: Diagnosis not present

## 2022-04-14 DIAGNOSIS — I6523 Occlusion and stenosis of bilateral carotid arteries: Secondary | ICD-10-CM | POA: Diagnosis not present

## 2022-04-14 DIAGNOSIS — I700 Atherosclerosis of aorta: Secondary | ICD-10-CM | POA: Diagnosis not present

## 2022-04-14 DIAGNOSIS — M1990 Unspecified osteoarthritis, unspecified site: Secondary | ICD-10-CM | POA: Diagnosis not present

## 2022-04-14 DIAGNOSIS — G459 Transient cerebral ischemic attack, unspecified: Secondary | ICD-10-CM | POA: Diagnosis not present

## 2022-04-14 DIAGNOSIS — R29810 Facial weakness: Secondary | ICD-10-CM | POA: Diagnosis not present

## 2022-04-14 DIAGNOSIS — K219 Gastro-esophageal reflux disease without esophagitis: Secondary | ICD-10-CM | POA: Diagnosis not present

## 2022-04-14 DIAGNOSIS — R200 Anesthesia of skin: Secondary | ICD-10-CM | POA: Diagnosis not present

## 2022-04-14 DIAGNOSIS — Z79891 Long term (current) use of opiate analgesic: Secondary | ICD-10-CM | POA: Diagnosis not present

## 2022-04-14 DIAGNOSIS — Z7902 Long term (current) use of antithrombotics/antiplatelets: Secondary | ICD-10-CM | POA: Diagnosis not present

## 2022-04-14 DIAGNOSIS — I63232 Cerebral infarction due to unspecified occlusion or stenosis of left carotid arteries: Secondary | ICD-10-CM | POA: Diagnosis not present

## 2022-04-14 DIAGNOSIS — I63233 Cerebral infarction due to unspecified occlusion or stenosis of bilateral carotid arteries: Secondary | ICD-10-CM | POA: Diagnosis not present

## 2022-04-14 DIAGNOSIS — I6389 Other cerebral infarction: Secondary | ICD-10-CM | POA: Diagnosis not present

## 2022-04-14 DIAGNOSIS — G8191 Hemiplegia, unspecified affecting right dominant side: Secondary | ICD-10-CM | POA: Diagnosis not present

## 2022-04-14 DIAGNOSIS — E119 Type 2 diabetes mellitus without complications: Secondary | ICD-10-CM | POA: Diagnosis not present

## 2022-04-14 DIAGNOSIS — Z888 Allergy status to other drugs, medicaments and biological substances status: Secondary | ICD-10-CM | POA: Diagnosis not present

## 2022-04-14 DIAGNOSIS — M542 Cervicalgia: Secondary | ICD-10-CM | POA: Diagnosis not present

## 2022-04-14 DIAGNOSIS — J449 Chronic obstructive pulmonary disease, unspecified: Secondary | ICD-10-CM | POA: Diagnosis not present

## 2022-04-14 DIAGNOSIS — Z880 Allergy status to penicillin: Secondary | ICD-10-CM | POA: Diagnosis not present

## 2022-04-14 DIAGNOSIS — I639 Cerebral infarction, unspecified: Secondary | ICD-10-CM | POA: Diagnosis not present

## 2022-04-14 DIAGNOSIS — G8929 Other chronic pain: Secondary | ICD-10-CM | POA: Diagnosis not present

## 2022-04-14 DIAGNOSIS — F17210 Nicotine dependence, cigarettes, uncomplicated: Secondary | ICD-10-CM | POA: Diagnosis not present

## 2022-04-14 DIAGNOSIS — I082 Rheumatic disorders of both aortic and tricuspid valves: Secondary | ICD-10-CM | POA: Diagnosis not present

## 2022-04-14 DIAGNOSIS — Z87442 Personal history of urinary calculi: Secondary | ICD-10-CM | POA: Diagnosis not present

## 2022-04-14 DIAGNOSIS — F32A Depression, unspecified: Secondary | ICD-10-CM | POA: Diagnosis not present

## 2022-04-14 DIAGNOSIS — R531 Weakness: Secondary | ICD-10-CM | POA: Diagnosis not present

## 2022-04-14 DIAGNOSIS — G4700 Insomnia, unspecified: Secondary | ICD-10-CM | POA: Diagnosis not present

## 2022-04-14 DIAGNOSIS — Z91030 Bee allergy status: Secondary | ICD-10-CM | POA: Diagnosis not present

## 2022-04-14 DIAGNOSIS — E876 Hypokalemia: Secondary | ICD-10-CM | POA: Diagnosis not present

## 2022-04-14 DIAGNOSIS — M5412 Radiculopathy, cervical region: Secondary | ICD-10-CM | POA: Diagnosis not present

## 2022-04-15 DIAGNOSIS — I6389 Other cerebral infarction: Secondary | ICD-10-CM | POA: Diagnosis not present

## 2022-04-22 DIAGNOSIS — K219 Gastro-esophageal reflux disease without esophagitis: Secondary | ICD-10-CM | POA: Diagnosis not present

## 2022-04-22 DIAGNOSIS — G4700 Insomnia, unspecified: Secondary | ICD-10-CM | POA: Diagnosis not present

## 2022-04-22 DIAGNOSIS — I10 Essential (primary) hypertension: Secondary | ICD-10-CM | POA: Diagnosis not present

## 2022-04-22 DIAGNOSIS — R110 Nausea: Secondary | ICD-10-CM | POA: Diagnosis not present

## 2022-04-22 DIAGNOSIS — I679 Cerebrovascular disease, unspecified: Secondary | ICD-10-CM | POA: Diagnosis not present

## 2022-04-22 DIAGNOSIS — M542 Cervicalgia: Secondary | ICD-10-CM | POA: Diagnosis not present

## 2022-04-22 DIAGNOSIS — G8929 Other chronic pain: Secondary | ICD-10-CM | POA: Diagnosis not present

## 2022-04-22 DIAGNOSIS — I6522 Occlusion and stenosis of left carotid artery: Secondary | ICD-10-CM | POA: Diagnosis not present

## 2022-04-24 ENCOUNTER — Other Ambulatory Visit: Payer: Self-pay | Admitting: Family

## 2022-04-24 DIAGNOSIS — E1140 Type 2 diabetes mellitus with diabetic neuropathy, unspecified: Secondary | ICD-10-CM

## 2022-05-19 DIAGNOSIS — L400 Psoriasis vulgaris: Secondary | ICD-10-CM | POA: Diagnosis not present

## 2022-05-19 DIAGNOSIS — L570 Actinic keratosis: Secondary | ICD-10-CM | POA: Diagnosis not present

## 2022-05-19 DIAGNOSIS — L578 Other skin changes due to chronic exposure to nonionizing radiation: Secondary | ICD-10-CM | POA: Diagnosis not present

## 2022-05-19 DIAGNOSIS — C44319 Basal cell carcinoma of skin of other parts of face: Secondary | ICD-10-CM | POA: Diagnosis not present

## 2022-05-19 DIAGNOSIS — L299 Pruritus, unspecified: Secondary | ICD-10-CM | POA: Diagnosis not present

## 2022-05-20 ENCOUNTER — Ambulatory Visit (INDEPENDENT_AMBULATORY_CARE_PROVIDER_SITE_OTHER): Payer: Medicare Other | Admitting: Neurology

## 2022-05-20 ENCOUNTER — Encounter: Payer: Self-pay | Admitting: Neurology

## 2022-05-20 ENCOUNTER — Telehealth: Payer: Self-pay | Admitting: Neurology

## 2022-05-20 VITALS — BP 170/77 | HR 84 | Ht 61.0 in | Wt 149.0 lb

## 2022-05-20 DIAGNOSIS — I63412 Cerebral infarction due to embolism of left middle cerebral artery: Secondary | ICD-10-CM | POA: Diagnosis not present

## 2022-05-20 DIAGNOSIS — I63032 Cerebral infarction due to thrombosis of left carotid artery: Secondary | ICD-10-CM | POA: Diagnosis not present

## 2022-05-20 MED ORDER — CLOPIDOGREL BISULFATE 75 MG PO TABS: 75.0000 mg | ORAL_TABLET | Freq: Every day | ORAL | 3 refills | Status: DC
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
## 2022-05-20 NOTE — Telephone Encounter (Signed)
Get medical record from [facility].

## 2022-05-21 DIAGNOSIS — R079 Chest pain, unspecified: Secondary | ICD-10-CM | POA: Diagnosis not present

## 2022-05-21 DIAGNOSIS — I6523 Occlusion and stenosis of bilateral carotid arteries: Secondary | ICD-10-CM | POA: Diagnosis not present

## 2022-05-21 DIAGNOSIS — E1151 Type 2 diabetes mellitus with diabetic peripheral angiopathy without gangrene: Secondary | ICD-10-CM | POA: Diagnosis not present

## 2022-05-21 DIAGNOSIS — Z01810 Encounter for preprocedural cardiovascular examination: Secondary | ICD-10-CM | POA: Diagnosis not present

## 2022-05-21 NOTE — Telephone Encounter (Signed)
Request made

## 2022-05-23 DIAGNOSIS — Z1231 Encounter for screening mammogram for malignant neoplasm of breast: Secondary | ICD-10-CM | POA: Diagnosis not present

## 2022-06-05 DIAGNOSIS — I700 Atherosclerosis of aorta: Secondary | ICD-10-CM | POA: Diagnosis not present

## 2022-06-05 DIAGNOSIS — I10 Essential (primary) hypertension: Secondary | ICD-10-CM | POA: Diagnosis not present

## 2022-06-05 DIAGNOSIS — F17200 Nicotine dependence, unspecified, uncomplicated: Secondary | ICD-10-CM | POA: Diagnosis not present

## 2022-06-05 DIAGNOSIS — E785 Hyperlipidemia, unspecified: Secondary | ICD-10-CM | POA: Diagnosis not present

## 2022-06-06 DIAGNOSIS — G8929 Other chronic pain: Secondary | ICD-10-CM | POA: Diagnosis not present

## 2022-06-06 DIAGNOSIS — I10 Essential (primary) hypertension: Secondary | ICD-10-CM | POA: Diagnosis not present

## 2022-06-06 DIAGNOSIS — G4700 Insomnia, unspecified: Secondary | ICD-10-CM | POA: Diagnosis not present

## 2022-06-06 DIAGNOSIS — R110 Nausea: Secondary | ICD-10-CM | POA: Diagnosis not present

## 2022-06-06 DIAGNOSIS — I679 Cerebrovascular disease, unspecified: Secondary | ICD-10-CM | POA: Diagnosis not present

## 2022-06-06 DIAGNOSIS — K219 Gastro-esophageal reflux disease without esophagitis: Secondary | ICD-10-CM | POA: Diagnosis not present

## 2022-06-06 DIAGNOSIS — I6522 Occlusion and stenosis of left carotid artery: Secondary | ICD-10-CM | POA: Diagnosis not present

## 2022-06-06 DIAGNOSIS — M542 Cervicalgia: Secondary | ICD-10-CM | POA: Diagnosis not present

## 2022-06-06 DIAGNOSIS — M159 Polyosteoarthritis, unspecified: Secondary | ICD-10-CM | POA: Diagnosis not present

## 2022-06-11 DIAGNOSIS — C44319 Basal cell carcinoma of skin of other parts of face: Secondary | ICD-10-CM | POA: Diagnosis not present

## 2022-06-23 DIAGNOSIS — I10 Essential (primary) hypertension: Secondary | ICD-10-CM | POA: Diagnosis not present

## 2022-06-23 DIAGNOSIS — F17200 Nicotine dependence, unspecified, uncomplicated: Secondary | ICD-10-CM | POA: Diagnosis not present

## 2022-06-23 DIAGNOSIS — E785 Hyperlipidemia, unspecified: Secondary | ICD-10-CM | POA: Diagnosis not present

## 2022-06-23 DIAGNOSIS — Z01810 Encounter for preprocedural cardiovascular examination: Secondary | ICD-10-CM | POA: Diagnosis not present

## 2022-07-02 DIAGNOSIS — M159 Polyosteoarthritis, unspecified: Secondary | ICD-10-CM | POA: Diagnosis not present

## 2022-07-02 DIAGNOSIS — K219 Gastro-esophageal reflux disease without esophagitis: Secondary | ICD-10-CM | POA: Diagnosis not present

## 2022-07-02 DIAGNOSIS — R110 Nausea: Secondary | ICD-10-CM | POA: Diagnosis not present

## 2022-07-02 DIAGNOSIS — I679 Cerebrovascular disease, unspecified: Secondary | ICD-10-CM | POA: Diagnosis not present

## 2022-07-02 DIAGNOSIS — I6522 Occlusion and stenosis of left carotid artery: Secondary | ICD-10-CM | POA: Diagnosis not present

## 2022-07-02 DIAGNOSIS — M542 Cervicalgia: Secondary | ICD-10-CM | POA: Diagnosis not present

## 2022-07-02 DIAGNOSIS — I10 Essential (primary) hypertension: Secondary | ICD-10-CM | POA: Diagnosis not present

## 2022-07-02 DIAGNOSIS — G8929 Other chronic pain: Secondary | ICD-10-CM | POA: Diagnosis not present

## 2022-07-02 DIAGNOSIS — G4700 Insomnia, unspecified: Secondary | ICD-10-CM | POA: Diagnosis not present

## 2022-07-08 DIAGNOSIS — I10 Essential (primary) hypertension: Secondary | ICD-10-CM | POA: Diagnosis not present

## 2022-07-08 DIAGNOSIS — Z01810 Encounter for preprocedural cardiovascular examination: Secondary | ICD-10-CM | POA: Diagnosis not present

## 2022-07-08 DIAGNOSIS — E785 Hyperlipidemia, unspecified: Secondary | ICD-10-CM | POA: Diagnosis not present

## 2022-07-08 DIAGNOSIS — F17200 Nicotine dependence, unspecified, uncomplicated: Secondary | ICD-10-CM | POA: Diagnosis not present

## 2022-07-09 ENCOUNTER — Ambulatory Visit: Payer: Medicare Other | Admitting: Adult Health

## 2022-07-14 DIAGNOSIS — E1151 Type 2 diabetes mellitus with diabetic peripheral angiopathy without gangrene: Secondary | ICD-10-CM | POA: Diagnosis not present

## 2022-07-14 DIAGNOSIS — I10 Essential (primary) hypertension: Secondary | ICD-10-CM | POA: Diagnosis not present

## 2022-07-14 DIAGNOSIS — I6523 Occlusion and stenosis of bilateral carotid arteries: Secondary | ICD-10-CM | POA: Diagnosis not present
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
## 2022-07-15 ENCOUNTER — Ambulatory Visit (INDEPENDENT_AMBULATORY_CARE_PROVIDER_SITE_OTHER): Payer: Medicare Other | Admitting: Adult Health

## 2022-07-15 ENCOUNTER — Encounter: Payer: Self-pay | Admitting: Adult Health

## 2022-07-15 VITALS — BP 122/71 | HR 71 | Ht 61.0 in | Wt 141.0 lb

## 2022-07-15 DIAGNOSIS — I63232 Cerebral infarction due to unspecified occlusion or stenosis of left carotid arteries: Secondary | ICD-10-CM | POA: Diagnosis not present

## 2022-07-15 DIAGNOSIS — F17200 Nicotine dependence, unspecified, uncomplicated: Secondary | ICD-10-CM

## 2022-07-15 NOTE — Patient Instructions (Addendum)
Continue aspirin 81 mg daily and atorvastatin for secondary stroke prevention - both should be managed by your PCP Dr. Truman Hayward as these medications will be recommended lifelong  Continue to follow with vascular surgery Dr. Terrial Rhodes with plans on pursuing left carotid endarterectomy.  Further medication management will be determined by Dr. Terrial Rhodes  Highly recommend complete tobacco cessation as continued use greatly increases risk of additional strokes and cardiovascular disease - please ensure you further discuss quitting options with your PCP Dr. Truman Hayward   Continue to follow up with PCP regarding cholesterol and blood pressure management  Maintain strict control of hypertension with blood pressure goal below 130/90, diabetes with hemoglobin A1c goal below 7.0 % and cholesterol with LDL cholesterol (bad cholesterol) goal below 70 mg/dL.   Signs of a Stroke? Follow the BEFAST method:  Balance Watch for a sudden loss of balance, trouble with coordination or vertigo Eyes Is there a sudden loss of vision in one or both eyes? Or double vision?  Face: Ask the person to smile. Does one side of the face droop or is it numb?  Arms: Ask the person to raise both arms. Does one arm drift downward? Is there weakness or numbness of a leg? Speech: Ask the person to repeat a simple phrase. Does the speech sound slurred/strange? Is the person confused ? Time: If you observe any of these signs, call 911.       Thank you for coming to see [place] at [facility]. I hope we have been able to provide you high quality care today.  You may receive a patient satisfaction survey over the next few weeks. We would appreciate your feedback and comments so that we may continue to improve ourselves and the health of our patients.     Stroke Prevention Some medical conditions and lifestyle choices can lead to a higher risk for a stroke. You can help to prevent a stroke by eating healthy foods and exercising. It  also helps to not smoke and to manage any health problems you may have. How can this condition affect me? A stroke is an emergency. It should be treated right away. A stroke can lead to brain damage or threaten your life. There is a better chance of surviving and getting better after a stroke if you get medical help right away. What can increase my risk? The following medical conditions may increase your risk of a stroke: Diseases of the heart and blood vessels (cardiovascular disease). High blood pressure (hypertension). Diabetes. High cholesterol. Sickle cell disease. Problems with blood clotting. Being very overweight. Sleeping problems (obstructivesleep apnea). Other risk factors include: Being older than age 38. A history of blood clots, stroke, or mini-stroke (TIA). Race, ethnic background, or a family history of stroke. Smoking or using tobacco products. Taking birth control pills, especially if you smoke. Heavy alcohol and drug use. Not being active. What actions can I take to prevent this? Manage your health conditions High cholesterol. Eat a healthy diet. If this is not enough to manage your cholesterol, you may need to take medicines. Take medicines as told by your doctor. High blood pressure. Try to keep your blood pressure below 130/80. If your blood pressure cannot be managed through a healthy diet and regular exercise, you may need to take medicines. Take medicines as told by your doctor. Ask your doctor if you should check your blood pressure at home. Have your blood pressure checked every year. Diabetes. Eat a healthy diet and get regular  exercise. If your blood sugar (glucose) cannot be managed through diet and exercise, you may need to take medicines. Take medicines as told by your doctor. Talk to your doctor about getting checked for sleeping problems. Signs of a problem can include: Snoring a lot. Feeling very tired. Make sure that you manage any other  conditions you have. Nutrition  Follow instructions from your doctor about what to eat or drink. You may be told to: Eat and drink fewer calories each day. Limit how much salt (sodium) you use to 1,500 milligrams (mg) each day. Use only healthy fats for cooking, such as olive oil, canola oil, and sunflower oil. Eat healthy foods. To do this: Choose foods that are high in fiber. These include whole grains, and fresh fruits and vegetables. Eat at least 5 servings of fruits and vegetables a day. Try to fill one-half of your plate with fruits and vegetables at each meal. Choose low-fat (lean) proteins. These include low-fat cuts of meat, chicken without skin, fish, tofu, beans, and nuts. Eat low-fat dairy products. Avoid foods that: Are high in salt. Have saturated fat. Have trans fat. Have cholesterol. Are processed or pre-made. Count how many carbohydrates you eat and drink each day. Lifestyle If you drink alcohol: Limit how much you have to: 0-1 drink a day for women who are not pregnant. 0-2 drinks a day for men. Know how much alcohol is in your drink. In the [place] one drink equals one 12 oz bottle of beer (37m), one 5 oz glass of wine (1434m, or one 1 oz glass of hard liquor (4465m Do not smoke or use any products that have nicotine or tobacco. If you need help quitting, ask your doctor. Avoid secondhand smoke. Do not use drugs. Activity  Try to stay at a healthy weight. Get at least 30 minutes of exercise on most days, such as: Fast walking. Biking. Swimming. Medicines Take over-the-counter and prescription medicines only as told by your doctor. Avoid taking birth control pills. Talk to your doctor about the risks of taking birth control pills if: You are over 35 70ars old. You smoke. You get very bad headaches. You have had a blood clot. Where to find more information American Stroke Association: www.strokeassociation.org Get help right away if: You or a loved one  has any signs of a stroke. "BE FAST" is an easy way to remember the warning signs: B - Balance. Dizziness, sudden trouble walking, or loss of balance. E - Eyes. Trouble seeing or a change in how you see. F - Face. Sudden weakness or loss of feeling of the face. The face or eyelid may droop on one side. A - Arms. Weakness or loss of feeling in an arm. This happens all of a sudden and most often on one side of the body. S - Speech. Sudden trouble speaking, slurred speech, or trouble understanding what people say. T - Time. Time to call emergency services. Write down what time symptoms started. You or a loved one has other signs of a stroke, such as: A sudden, very bad headache with no known cause. Feeling like you may vomit (nausea). Vomiting. A seizure. These symptoms may be an emergency. Get help right away. Call your local emergency services (911 in the [place] Do not wait to see if the symptoms will go away. Do not drive yourself to the hospital. Summary You can help to prevent a stroke by eating healthy, exercising, and not smoking. It also helps to manage any health  problems you have. Do not smoke or use any products that contain nicotine or tobacco. Get help right away if you or a loved one has any signs of a stroke. This information is not intended to replace advice given to you by your health care provider. Make sure you discuss any questions you have with your health care provider. Document Revised: 07/02/2020 Document Reviewed: 07/02/2020 Elsevier Patient Education  [place].

## 2022-07-25 DIAGNOSIS — J449 Chronic obstructive pulmonary disease, unspecified: Secondary | ICD-10-CM | POA: Diagnosis not present

## 2022-07-25 DIAGNOSIS — E1151 Type 2 diabetes mellitus with diabetic peripheral angiopathy without gangrene: Secondary | ICD-10-CM | POA: Diagnosis not present

## 2022-07-25 DIAGNOSIS — Z8673 Personal history of transient ischemic attack (TIA), and cerebral infarction without residual deficits: Secondary | ICD-10-CM | POA: Diagnosis not present

## 2022-07-25 DIAGNOSIS — I6523 Occlusion and stenosis of bilateral carotid arteries: Secondary | ICD-10-CM | POA: Diagnosis not present

## 2022-07-25 DIAGNOSIS — Z7982 Long term (current) use of aspirin: Secondary | ICD-10-CM | POA: Diagnosis not present

## 2022-07-25 DIAGNOSIS — M797 Fibromyalgia: Secondary | ICD-10-CM | POA: Diagnosis not present

## 2022-07-25 DIAGNOSIS — E1140 Type 2 diabetes mellitus with diabetic neuropathy, unspecified: Secondary | ICD-10-CM | POA: Diagnosis not present

## 2022-07-25 DIAGNOSIS — D3501 Benign neoplasm of right adrenal gland: Secondary | ICD-10-CM | POA: Diagnosis not present

## 2022-07-25 DIAGNOSIS — M159 Polyosteoarthritis, unspecified: Secondary | ICD-10-CM | POA: Diagnosis not present

## 2022-07-25 DIAGNOSIS — F17210 Nicotine dependence, cigarettes, uncomplicated: Secondary | ICD-10-CM | POA: Diagnosis not present

## 2022-07-25 DIAGNOSIS — Z79899 Other long term (current) drug therapy: Secondary | ICD-10-CM | POA: Diagnosis not present

## 2022-07-25 DIAGNOSIS — Z7902 Long term (current) use of antithrombotics/antiplatelets: Secondary | ICD-10-CM | POA: Diagnosis not present

## 2022-07-25 DIAGNOSIS — E782 Mixed hyperlipidemia: Secondary | ICD-10-CM | POA: Diagnosis not present

## 2022-07-25 DIAGNOSIS — I10 Essential (primary) hypertension: Secondary | ICD-10-CM | POA: Diagnosis not present

## 2022-07-25 DIAGNOSIS — K219 Gastro-esophageal reflux disease without esophagitis: Secondary | ICD-10-CM | POA: Diagnosis not present

## 2022-07-25 DIAGNOSIS — I6522 Occlusion and stenosis of left carotid artery: Secondary | ICD-10-CM | POA: Diagnosis not present

## 2022-08-04 DIAGNOSIS — E785 Hyperlipidemia, unspecified: Secondary | ICD-10-CM | POA: Diagnosis not present

## 2022-08-04 DIAGNOSIS — R0600 Dyspnea, unspecified: Secondary | ICD-10-CM | POA: Diagnosis not present

## 2022-08-04 DIAGNOSIS — R079 Chest pain, unspecified: Secondary | ICD-10-CM | POA: Diagnosis not present

## 2022-08-04 DIAGNOSIS — I10 Essential (primary) hypertension: Secondary | ICD-10-CM | POA: Diagnosis not present

## 2022-08-04 DIAGNOSIS — R5382 Chronic fatigue, unspecified: Secondary | ICD-10-CM | POA: Diagnosis not present

## 2022-08-05 DIAGNOSIS — R5382 Chronic fatigue, unspecified: Secondary | ICD-10-CM | POA: Diagnosis not present

## 2022-08-05 DIAGNOSIS — L0390 Cellulitis, unspecified: Secondary | ICD-10-CM | POA: Diagnosis not present

## 2022-08-05 DIAGNOSIS — M542 Cervicalgia: Secondary | ICD-10-CM | POA: Diagnosis not present

## 2022-08-05 DIAGNOSIS — K219 Gastro-esophageal reflux disease without esophagitis: Secondary | ICD-10-CM | POA: Diagnosis not present

## 2022-08-05 DIAGNOSIS — G629 Polyneuropathy, unspecified: Secondary | ICD-10-CM | POA: Diagnosis not present

## 2022-08-05 DIAGNOSIS — I10 Essential (primary) hypertension: Secondary | ICD-10-CM | POA: Diagnosis not present

## 2022-08-05 DIAGNOSIS — G8929 Other chronic pain: Secondary | ICD-10-CM | POA: Diagnosis not present

## 2022-08-05 DIAGNOSIS — I679 Cerebrovascular disease, unspecified: Secondary | ICD-10-CM | POA: Diagnosis not present

## 2022-08-05 DIAGNOSIS — E782 Mixed hyperlipidemia: Secondary | ICD-10-CM | POA: Diagnosis not present

## 2022-08-05 DIAGNOSIS — M25512 Pain in left shoulder: Secondary | ICD-10-CM | POA: Diagnosis not present

## 2022-09-04 DIAGNOSIS — G8929 Other chronic pain: Secondary | ICD-10-CM | POA: Diagnosis not present

## 2022-09-04 DIAGNOSIS — E538 Deficiency of other specified B group vitamins: Secondary | ICD-10-CM | POA: Diagnosis not present

## 2022-09-04 DIAGNOSIS — I6522 Occlusion and stenosis of left carotid artery: Secondary | ICD-10-CM | POA: Diagnosis not present

## 2022-09-04 DIAGNOSIS — M25512 Pain in left shoulder: Secondary | ICD-10-CM | POA: Diagnosis not present

## 2022-09-04 DIAGNOSIS — K219 Gastro-esophageal reflux disease without esophagitis: Secondary | ICD-10-CM | POA: Diagnosis not present

## 2022-09-04 DIAGNOSIS — I10 Essential (primary) hypertension: Secondary | ICD-10-CM | POA: Diagnosis not present

## 2022-09-04 DIAGNOSIS — M159 Polyosteoarthritis, unspecified: Secondary | ICD-10-CM | POA: Diagnosis not present

## 2022-09-04 DIAGNOSIS — I679 Cerebrovascular disease, unspecified: Secondary | ICD-10-CM | POA: Diagnosis not present

## 2022-09-04 DIAGNOSIS — M542 Cervicalgia: Secondary | ICD-10-CM | POA: Diagnosis not present

## 2022-09-05 DIAGNOSIS — M159 Polyosteoarthritis, unspecified: Secondary | ICD-10-CM | POA: Diagnosis not present

## 2022-09-05 DIAGNOSIS — I6522 Occlusion and stenosis of left carotid artery: Secondary | ICD-10-CM | POA: Diagnosis not present

## 2022-09-05 DIAGNOSIS — I10 Essential (primary) hypertension: Secondary | ICD-10-CM | POA: Diagnosis not present

## 2022-09-05 DIAGNOSIS — I679 Cerebrovascular disease, unspecified: Secondary | ICD-10-CM | POA: Diagnosis not present

## 2022-09-05 DIAGNOSIS — I6523 Occlusion and stenosis of bilateral carotid arteries: Secondary | ICD-10-CM | POA: Diagnosis not present

## 2022-09-05 DIAGNOSIS — M542 Cervicalgia: Secondary | ICD-10-CM | POA: Diagnosis not present

## 2022-09-05 DIAGNOSIS — G8929 Other chronic pain: Secondary | ICD-10-CM | POA: Diagnosis not present

## 2022-09-05 DIAGNOSIS — R110 Nausea: Secondary | ICD-10-CM | POA: Diagnosis not present

## 2022-09-05 DIAGNOSIS — K219 Gastro-esophageal reflux disease without esophagitis: Secondary | ICD-10-CM | POA: Diagnosis not present

## 2022-09-05 DIAGNOSIS — E782 Mixed hyperlipidemia: Secondary | ICD-10-CM | POA: Diagnosis not present

## 2023-01-26 ENCOUNTER — Telehealth: Payer: Self-pay | Admitting: Internal Medicine

## 2023-01-26 NOTE — Telephone Encounter (Signed)
Copied from [place] [phone]. Topic: Medicare AWV >> Jan 26, 2023 12:18 PM [name] wrote: Reason for CRM: LVM FOR PATIENT TO CALL [name] [phone] TO SCHEDULE AWVI Gabriella Richards

## 2023-05-21 DIAGNOSIS — K219 Gastro-esophageal reflux disease without esophagitis: Secondary | ICD-10-CM | POA: Diagnosis not present

## 2023-05-21 DIAGNOSIS — I6522 Occlusion and stenosis of left carotid artery: Secondary | ICD-10-CM | POA: Diagnosis not present

## 2023-05-21 DIAGNOSIS — G8929 Other chronic pain: Secondary | ICD-10-CM | POA: Diagnosis not present

## 2023-05-21 DIAGNOSIS — B3731 Acute candidiasis of vulva and vagina: Secondary | ICD-10-CM | POA: Diagnosis not present

## 2023-05-21 DIAGNOSIS — I679 Cerebrovascular disease, unspecified: Secondary | ICD-10-CM | POA: Diagnosis not present

## 2023-05-21 DIAGNOSIS — I10 Essential (primary) hypertension: Secondary | ICD-10-CM | POA: Diagnosis not present

## 2023-05-21 DIAGNOSIS — E538 Deficiency of other specified B group vitamins: Secondary | ICD-10-CM | POA: Diagnosis not present

## 2023-05-21 DIAGNOSIS — M159 Polyosteoarthritis, unspecified: Secondary | ICD-10-CM | POA: Diagnosis not present

## 2023-05-21 DIAGNOSIS — M542 Cervicalgia: Secondary | ICD-10-CM | POA: Diagnosis not present

## 2023-06-24 DIAGNOSIS — J0100 Acute maxillary sinusitis, unspecified: Secondary | ICD-10-CM | POA: Diagnosis not present

## 2023-06-24 DIAGNOSIS — I10 Essential (primary) hypertension: Secondary | ICD-10-CM | POA: Diagnosis not present

## 2023-06-24 DIAGNOSIS — I679 Cerebrovascular disease, unspecified: Secondary | ICD-10-CM | POA: Diagnosis not present

## 2023-06-24 DIAGNOSIS — E1140 Type 2 diabetes mellitus with diabetic neuropathy, unspecified: Secondary | ICD-10-CM | POA: Diagnosis not present

## 2023-06-24 DIAGNOSIS — G8929 Other chronic pain: Secondary | ICD-10-CM | POA: Diagnosis not present

## 2023-06-24 DIAGNOSIS — M542 Cervicalgia: Secondary | ICD-10-CM | POA: Diagnosis not present

## 2023-06-24 DIAGNOSIS — K219 Gastro-esophageal reflux disease without esophagitis: Secondary | ICD-10-CM | POA: Diagnosis not present

## 2023-06-24 DIAGNOSIS — E538 Deficiency of other specified B group vitamins: Secondary | ICD-10-CM | POA: Diagnosis not present

## 2023-06-24 DIAGNOSIS — Z0000 Encounter for general adult medical examination without abnormal findings: Secondary | ICD-10-CM | POA: Diagnosis not present

## 2023-07-22 DIAGNOSIS — K219 Gastro-esophageal reflux disease without esophagitis: Secondary | ICD-10-CM | POA: Diagnosis not present

## 2023-07-22 DIAGNOSIS — R110 Nausea: Secondary | ICD-10-CM | POA: Diagnosis not present

## 2023-07-22 DIAGNOSIS — M542 Cervicalgia: Secondary | ICD-10-CM | POA: Diagnosis not present

## 2023-07-22 DIAGNOSIS — M159 Polyosteoarthritis, unspecified: Secondary | ICD-10-CM | POA: Diagnosis not present

## 2023-07-22 DIAGNOSIS — E538 Deficiency of other specified B group vitamins: Secondary | ICD-10-CM | POA: Diagnosis not present

## 2023-07-22 DIAGNOSIS — G8929 Other chronic pain: Secondary | ICD-10-CM | POA: Diagnosis not present

## 2023-07-22 DIAGNOSIS — I10 Essential (primary) hypertension: Secondary | ICD-10-CM | POA: Diagnosis not present

## 2023-07-22 DIAGNOSIS — I6522 Occlusion and stenosis of left carotid artery: Secondary | ICD-10-CM | POA: Diagnosis not present

## 2023-07-22 DIAGNOSIS — I679 Cerebrovascular disease, unspecified: Secondary | ICD-10-CM | POA: Diagnosis not present

## 2023-07-26 DIAGNOSIS — R6889 Other general symptoms and signs: Secondary | ICD-10-CM | POA: Diagnosis not present

## 2023-07-26 DIAGNOSIS — M25519 Pain in unspecified shoulder: Secondary | ICD-10-CM | POA: Diagnosis not present

## 2023-07-26 DIAGNOSIS — R079 Chest pain, unspecified: Secondary | ICD-10-CM | POA: Diagnosis not present

## 2023-07-26 DIAGNOSIS — Z743 Need for continuous supervision: Secondary | ICD-10-CM | POA: Diagnosis not present

## 2023-07-27 DIAGNOSIS — R0789 Other chest pain: Secondary | ICD-10-CM | POA: Diagnosis not present

## 2023-07-27 DIAGNOSIS — R079 Chest pain, unspecified: Secondary | ICD-10-CM | POA: Diagnosis not present

## 2023-07-27 DIAGNOSIS — I10 Essential (primary) hypertension: Secondary | ICD-10-CM | POA: Diagnosis not present

## 2023-07-27 DIAGNOSIS — Z8249 Family history of ischemic heart disease and other diseases of the circulatory system: Secondary | ICD-10-CM | POA: Diagnosis not present

## 2023-07-27 DIAGNOSIS — M1289 Other specific arthropathies, not elsewhere classified, multiple sites: Secondary | ICD-10-CM | POA: Diagnosis not present

## 2023-07-27 DIAGNOSIS — Z880 Allergy status to penicillin: Secondary | ICD-10-CM | POA: Diagnosis not present

## 2023-07-27 DIAGNOSIS — R9431 Abnormal electrocardiogram [ECG] [EKG]: Secondary | ICD-10-CM | POA: Diagnosis not present

## 2023-07-27 DIAGNOSIS — Z91030 Bee allergy status: Secondary | ICD-10-CM | POA: Diagnosis not present

## 2023-07-27 DIAGNOSIS — R918 Other nonspecific abnormal finding of lung field: Secondary | ICD-10-CM | POA: Diagnosis not present

## 2023-07-27 DIAGNOSIS — F17210 Nicotine dependence, cigarettes, uncomplicated: Secondary | ICD-10-CM | POA: Diagnosis not present

## 2023-07-27 DIAGNOSIS — Z823 Family history of stroke: Secondary | ICD-10-CM | POA: Diagnosis not present

## 2023-07-27 DIAGNOSIS — H9190 Unspecified hearing loss, unspecified ear: Secondary | ICD-10-CM | POA: Diagnosis not present

## 2023-07-27 DIAGNOSIS — K219 Gastro-esophageal reflux disease without esophagitis: Secondary | ICD-10-CM | POA: Diagnosis not present

## 2023-07-27 DIAGNOSIS — F32A Depression, unspecified: Secondary | ICD-10-CM | POA: Diagnosis not present

## 2023-07-27 DIAGNOSIS — Z79899 Other long term (current) drug therapy: Secondary | ICD-10-CM | POA: Diagnosis not present

## 2023-08-02 DIAGNOSIS — R079 Chest pain, unspecified: Secondary | ICD-10-CM | POA: Diagnosis not present

## 2023-08-19 DIAGNOSIS — G8929 Other chronic pain: Secondary | ICD-10-CM | POA: Diagnosis not present

## 2023-08-19 DIAGNOSIS — K219 Gastro-esophageal reflux disease without esophagitis: Secondary | ICD-10-CM | POA: Diagnosis not present

## 2023-08-19 DIAGNOSIS — J0100 Acute maxillary sinusitis, unspecified: Secondary | ICD-10-CM | POA: Diagnosis not present

## 2023-08-19 DIAGNOSIS — M159 Polyosteoarthritis, unspecified: Secondary | ICD-10-CM | POA: Diagnosis not present

## 2023-08-19 DIAGNOSIS — I679 Cerebrovascular disease, unspecified: Secondary | ICD-10-CM | POA: Diagnosis not present

## 2023-08-19 DIAGNOSIS — E538 Deficiency of other specified B group vitamins: Secondary | ICD-10-CM | POA: Diagnosis not present

## 2023-08-19 DIAGNOSIS — I10 Essential (primary) hypertension: Secondary | ICD-10-CM | POA: Diagnosis not present

## 2023-08-19 DIAGNOSIS — M542 Cervicalgia: Secondary | ICD-10-CM | POA: Diagnosis not present

## 2023-08-19 DIAGNOSIS — I6522 Occlusion and stenosis of left carotid artery: Secondary | ICD-10-CM | POA: Diagnosis not present

## 2024-09-01 DIAGNOSIS — E538 Deficiency of other specified B group vitamins: Secondary | ICD-10-CM | POA: Diagnosis not present

## 2024-09-01 DIAGNOSIS — M542 Cervicalgia: Secondary | ICD-10-CM | POA: Diagnosis not present

## 2024-09-01 DIAGNOSIS — R519 Headache, unspecified: Secondary | ICD-10-CM | POA: Diagnosis not present

## 2024-09-01 DIAGNOSIS — I6522 Occlusion and stenosis of left carotid artery: Secondary | ICD-10-CM | POA: Diagnosis not present

## 2024-09-01 DIAGNOSIS — F411 Generalized anxiety disorder: Secondary | ICD-10-CM | POA: Diagnosis not present

## 2024-09-01 DIAGNOSIS — E1140 Type 2 diabetes mellitus with diabetic neuropathy, unspecified: Secondary | ICD-10-CM | POA: Diagnosis not present

## 2024-09-01 DIAGNOSIS — F5104 Psychophysiologic insomnia: Secondary | ICD-10-CM | POA: Diagnosis not present

## 2024-09-01 DIAGNOSIS — G8929 Other chronic pain: Secondary | ICD-10-CM | POA: Diagnosis not present

## 2024-09-01 DIAGNOSIS — I10 Essential (primary) hypertension: Secondary | ICD-10-CM | POA: Diagnosis not present

## 2024-09-29 DIAGNOSIS — G8929 Other chronic pain: Secondary | ICD-10-CM | POA: Diagnosis not present

## 2024-09-29 DIAGNOSIS — I10 Essential (primary) hypertension: Secondary | ICD-10-CM | POA: Diagnosis not present

## 2024-09-29 DIAGNOSIS — I6522 Occlusion and stenosis of left carotid artery: Secondary | ICD-10-CM | POA: Diagnosis not present

## 2024-09-29 DIAGNOSIS — F339 Major depressive disorder, recurrent, unspecified: Secondary | ICD-10-CM | POA: Diagnosis not present

## 2024-09-29 DIAGNOSIS — F411 Generalized anxiety disorder: Secondary | ICD-10-CM | POA: Diagnosis not present

## 2024-09-29 DIAGNOSIS — E1140 Type 2 diabetes mellitus with diabetic neuropathy, unspecified: Secondary | ICD-10-CM | POA: Diagnosis not present

## 2024-09-29 DIAGNOSIS — F5104 Psychophysiologic insomnia: Secondary | ICD-10-CM | POA: Diagnosis not present

## 2024-09-29 DIAGNOSIS — M542 Cervicalgia: Secondary | ICD-10-CM | POA: Diagnosis not present

## 2024-09-29 DIAGNOSIS — R519 Headache, unspecified: Secondary | ICD-10-CM | POA: Diagnosis not present

## 2024-10-07 DIAGNOSIS — E1151 Type 2 diabetes mellitus with diabetic peripheral angiopathy without gangrene: Secondary | ICD-10-CM | POA: Diagnosis not present

## 2024-10-07 DIAGNOSIS — I7143 Infrarenal abdominal aortic aneurysm, without rupture: Secondary | ICD-10-CM | POA: Diagnosis not present

## 2024-10-07 DIAGNOSIS — I6523 Occlusion and stenosis of bilateral carotid arteries: Secondary | ICD-10-CM | POA: Diagnosis not present

## 2024-10-12 DIAGNOSIS — M542 Cervicalgia: Secondary | ICD-10-CM | POA: Diagnosis not present

## 2024-10-12 DIAGNOSIS — I10 Essential (primary) hypertension: Secondary | ICD-10-CM | POA: Diagnosis not present

## 2024-10-12 DIAGNOSIS — I6522 Occlusion and stenosis of left carotid artery: Secondary | ICD-10-CM | POA: Diagnosis not present

## 2024-10-12 DIAGNOSIS — F411 Generalized anxiety disorder: Secondary | ICD-10-CM | POA: Diagnosis not present

## 2024-10-12 DIAGNOSIS — F339 Major depressive disorder, recurrent, unspecified: Secondary | ICD-10-CM | POA: Diagnosis not present

## 2024-10-12 DIAGNOSIS — R519 Headache, unspecified: Secondary | ICD-10-CM | POA: Diagnosis not present

## 2024-10-12 DIAGNOSIS — G8929 Other chronic pain: Secondary | ICD-10-CM | POA: Diagnosis not present

## 2024-10-12 DIAGNOSIS — F5104 Psychophysiologic insomnia: Secondary | ICD-10-CM | POA: Diagnosis not present

## 2024-10-12 DIAGNOSIS — E1140 Type 2 diabetes mellitus with diabetic neuropathy, unspecified: Secondary | ICD-10-CM | POA: Diagnosis not present

## 2024-10-12 DIAGNOSIS — E782 Mixed hyperlipidemia: Secondary | ICD-10-CM | POA: Diagnosis not present

## 2024-10-24 DIAGNOSIS — E1151 Type 2 diabetes mellitus with diabetic peripheral angiopathy without gangrene: Secondary | ICD-10-CM | POA: Diagnosis not present

## 2024-10-24 DIAGNOSIS — I7143 Infrarenal abdominal aortic aneurysm, without rupture: Secondary | ICD-10-CM | POA: Diagnosis not present

## 2024-10-24 DIAGNOSIS — I6523 Occlusion and stenosis of bilateral carotid arteries: Secondary | ICD-10-CM | POA: Diagnosis not present

## 2024-10-24 DIAGNOSIS — Z48812 Encounter for surgical aftercare following surgery on the circulatory system: Secondary | ICD-10-CM | POA: Diagnosis not present

## 2024-10-28 DIAGNOSIS — I10 Essential (primary) hypertension: Secondary | ICD-10-CM | POA: Diagnosis not present

## 2024-10-28 DIAGNOSIS — E1140 Type 2 diabetes mellitus with diabetic neuropathy, unspecified: Secondary | ICD-10-CM | POA: Diagnosis not present

## 2024-10-28 DIAGNOSIS — G8929 Other chronic pain: Secondary | ICD-10-CM | POA: Diagnosis not present

## 2024-10-28 DIAGNOSIS — F339 Major depressive disorder, recurrent, unspecified: Secondary | ICD-10-CM | POA: Diagnosis not present

## 2024-10-28 DIAGNOSIS — E538 Deficiency of other specified B group vitamins: Secondary | ICD-10-CM | POA: Diagnosis not present

## 2024-10-28 DIAGNOSIS — M542 Cervicalgia: Secondary | ICD-10-CM | POA: Diagnosis not present

## 2024-10-28 DIAGNOSIS — Z23 Encounter for immunization: Secondary | ICD-10-CM | POA: Diagnosis not present

## 2024-10-28 DIAGNOSIS — K219 Gastro-esophageal reflux disease without esophagitis: Secondary | ICD-10-CM | POA: Diagnosis not present

## 2024-10-28 DIAGNOSIS — I6522 Occlusion and stenosis of left carotid artery: Secondary | ICD-10-CM | POA: Diagnosis not present

## 2024-10-28 DIAGNOSIS — R519 Headache, unspecified: Secondary | ICD-10-CM | POA: Diagnosis not present

## 2024-10-31 DIAGNOSIS — H2513 Age-related nuclear cataract, bilateral: Secondary | ICD-10-CM | POA: Diagnosis not present

## 2024-10-31 DIAGNOSIS — E119 Type 2 diabetes mellitus without complications: Secondary | ICD-10-CM | POA: Diagnosis not present
